# Patient Record
Sex: Female | Born: 1975 | Race: White | Hispanic: No | Marital: Married | Smoking: Former smoker
Health system: Southern US, Community
[De-identification: ages and names within clinical notes are randomized; demographics above are authoritative.]

## PROBLEM LIST (undated history)

## (undated) DIAGNOSIS — Z8619 Personal history of other infectious and parasitic diseases: Secondary | ICD-10-CM

## (undated) DIAGNOSIS — Z8639 Personal history of other endocrine, nutritional and metabolic disease: Secondary | ICD-10-CM

## (undated) DIAGNOSIS — N8110 Cystocele, unspecified: Secondary | ICD-10-CM

## (undated) DIAGNOSIS — M329 Systemic lupus erythematosus, unspecified: Secondary | ICD-10-CM

## (undated) DIAGNOSIS — R112 Nausea with vomiting, unspecified: Secondary | ICD-10-CM

## (undated) DIAGNOSIS — Z8739 Personal history of other diseases of the musculoskeletal system and connective tissue: Secondary | ICD-10-CM

## (undated) DIAGNOSIS — N816 Rectocele: Secondary | ICD-10-CM

## (undated) DIAGNOSIS — N390 Urinary tract infection, site not specified: Secondary | ICD-10-CM

## (undated) DIAGNOSIS — Z9889 Other specified postprocedural states: Secondary | ICD-10-CM

## (undated) DIAGNOSIS — E039 Hypothyroidism, unspecified: Secondary | ICD-10-CM

## (undated) DIAGNOSIS — E079 Disorder of thyroid, unspecified: Secondary | ICD-10-CM

## (undated) DIAGNOSIS — IMO0002 Reserved for concepts with insufficient information to code with codable children: Secondary | ICD-10-CM

## (undated) HISTORY — PX: TONSILLECTOMY: SUR1361

## (undated) HISTORY — DX: Cystocele, unspecified: N81.10

## (undated) HISTORY — DX: Personal history of other infectious and parasitic diseases: Z86.19

## (undated) HISTORY — PX: KNEE OPEN LATERAL RELEASE: SHX1897

## (undated) HISTORY — DX: Reserved for concepts with insufficient information to code with codable children: IMO0002

## (undated) HISTORY — DX: Systemic lupus erythematosus, unspecified: M32.9

## (undated) HISTORY — DX: Personal history of other diseases of the musculoskeletal system and connective tissue: Z87.39

## (undated) HISTORY — DX: Urinary tract infection, site not specified: N39.0

## (undated) HISTORY — DX: Rectocele: N81.6

## (undated) HISTORY — DX: Disorder of thyroid, unspecified: E07.9

## (undated) HISTORY — DX: Personal history of other endocrine, nutritional and metabolic disease: Z86.39

---
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
## 2006-08-16 HISTORY — PX: SEPTOPLASTY: SHX2393

## 2006-08-16 HISTORY — PX: APPENDECTOMY: SHX54

## 2008-01-11 ENCOUNTER — Inpatient Hospital Stay (HOSPITAL_COMMUNITY): Admission: AD | Admit: 2008-01-11 | Discharge: 2008-01-13 | Payer: Self-pay | Admitting: Obstetrics and Gynecology

## 2010-10-10 ENCOUNTER — Inpatient Hospital Stay (HOSPITAL_COMMUNITY): Admission: AD | Admit: 2010-10-10 | Payer: Self-pay | Admitting: Obstetrics and Gynecology

## 2010-10-11 ENCOUNTER — Inpatient Hospital Stay (HOSPITAL_COMMUNITY)
Admission: AD | Admit: 2010-10-11 | Discharge: 2010-10-13 | DRG: 372 | Disposition: A | Discharge status: Home / Self Care | Payer: BC Managed Care – PPO | Source: Ambulatory Visit | Attending: Obstetrics and Gynecology | Admitting: Obstetrics and Gynecology

## 2010-10-11 DIAGNOSIS — E079 Disorder of thyroid, unspecified: Secondary | ICD-10-CM | POA: Diagnosis present

## 2010-10-11 DIAGNOSIS — O09529 Supervision of elderly multigravida, unspecified trimester: Secondary | ICD-10-CM | POA: Diagnosis present

## 2010-10-11 DIAGNOSIS — E039 Hypothyroidism, unspecified: Secondary | ICD-10-CM | POA: Diagnosis present

## 2010-10-12 LAB — CBC
HCT: 34.9 % — ABNORMAL LOW (ref 36.0–46.0)
MCH: 29.3 pg (ref 26.0–34.0)
MCHC: 33 g/dL (ref 30.0–36.0)
MCV: 88.8 fL (ref 78.0–100.0)
RDW: 14.2 % (ref 11.5–15.5)

## 2010-12-29 NOTE — Op Note (Signed)
Shannon Howell, Shannon Howell           ACCOUNT NO.:  [id number]   MEDICAL RECORD NO.:  [id number]          PATIENT TYPE:  INP   LOCATION:  9140                          FACILITY:  [facility]   PHYSICIAN:  Kendra H. Tenny Craw, MD     DATE OF BIRTH:  1975-10-26   DATE OF PROCEDURE:  01/11/2008  DATE OF DISCHARGE:                               OPERATIVE REPORT   PREOPERATIVE DIAGNOSES:  1. Left vaginal sulcus laceration.  2. Right labial laceration.  3. Second-degree laceration.   POSTOPERATIVE DIAGNOSES:  1. Left vaginal sulcus laceration.  2. Right labial laceration.  3. Second-degree laceration.   PROCEDURE:  Exam under anesthesia and repair of left paravaginal space,  left vaginal sulcus tear, right labial tear, and second-degree  laceration.   SURGEON:  Freddrick March. Tenny Craw, MD   ASSISTANTSLarene Beach, surgical tech.  Vee, surgical tech.   ANESTHESIA:  Epidural.   FINDINGS:  A large left vaginal sulcus laceration, left paravaginal  defect, right labial laceration, and a second-degree laceration.   SPECIMENS:  None.   ESTIMATED BLOOD LOSS:  300 mL.   PROCEDURE:  [name] is a 35 year old G1, P0; now G1, P1, status  post a spontaneous vaginal delivery of an 8 pounds 14 ounces female  infant in the vertex presentation at approximately 10 p.m.  Prior to  delivery of the infant, during pushing, the nurse noted a sudden onset  of vaginal bleeding.  At this point, I was called to the room for  delivery.  Fetal heart tones at this time were reassuring.  Upon  inspection of the perineum, a small pumping vessel was noted on the left  hand side of the vagina, which was tamponoded with a hemostat.  The  vaginal delivery was completed without complication.  After delivery  upon inspection of the vagina, a large left vaginal sulcus tear was  noted which extended up to the level of the cervix, and a large left  paravaginal defect was noted.  Given the suboptimal visualization and  retraction, the  decision was made to proceed to the operating room for  repair of the sulcus laceration.  The vagina and paravaginal space were  packed and the patient was transported to the operating room.  In the  operating room, the patient was placed in dorsal lithotomy position in  [place] stirrups.  Epidural anesthesia was confirmed to be adequate.  Upon  inspection of the vagina, again, a large sulcus tear up to the level of  the cervix was noted, and the large paravaginal defect was noted, which  extended just posterior to the pubic bone.  Upon the lacerated vaginal  portion of the tear, bleeding was noted, and this was repaired with a 3-  0 Vicryl in a running locked fashion.  The vaginal sulcus tear and  paravaginal defects were then repaired with 3-0 Vicryl in a running  fashion, and the electrocautery unit was used to cauterize some  extraneous bleeders.  A left labial laceration was repaired with 4-0  Vicryl in a running fashion.  A right labial tear was repaired with 4-0  Vicryl in  a running fashion, and a second-degree laceration was then  repaired with 3-0 Vicryl in the usual fashion.  All sponge, lap, and  needle counts were correct x2.  The patient tolerated the procedure well  and was brought to the recovery room in stable condition following the  procedure.       Freddrick March. Tenny Craw, MD  Electronically Signed     KHR/MEDQ  D:  01/12/2008  T:  01/12/2008  Job:  [id number]

## 2011-05-12 LAB — CBC
Hemoglobin: 9.5 — ABNORMAL LOW
MCHC: 34
MCHC: 34.2
MCHC: 35.2
MCV: 91.1
Platelets: 169
Platelets: 176
RBC: 2.99 — ABNORMAL LOW
RDW: 14
RDW: 14
RDW: 14.2

## 2011-05-12 LAB — COMPREHENSIVE METABOLIC PANEL
ALT: 12
Albumin: 3.1 — ABNORMAL LOW
Alkaline Phosphatase: 138 — ABNORMAL HIGH
Calcium: 9.2
Potassium: 3.5
Sodium: 135
Total Protein: 6.3

## 2011-05-12 LAB — RPR: RPR Ser Ql: NONREACTIVE

## 2011-05-12 LAB — TYPE AND SCREEN: Antibody Screen: NEGATIVE

## 2011-05-12 LAB — ABO/RH: ABO/RH(D): O POS

## 2011-05-12 LAB — URIC ACID: Uric Acid, Serum: 4.3

## 2011-10-13 ENCOUNTER — Encounter: Payer: BC Managed Care – PPO | Admitting: Obstetrics and Gynecology

## 2011-11-23 DIAGNOSIS — N61 Mastitis without abscess: Secondary | ICD-10-CM | POA: Insufficient documentation

## 2011-11-24 ENCOUNTER — Ambulatory Visit (INDEPENDENT_AMBULATORY_CARE_PROVIDER_SITE_OTHER): Payer: BC Managed Care – PPO | Admitting: Obstetrics and Gynecology

## 2011-11-24 ENCOUNTER — Encounter: Payer: Self-pay | Admitting: Obstetrics and Gynecology

## 2011-11-24 VITALS — BP 122/80 | Resp 18 | Ht 70.0 in | Wt 150.0 lb

## 2011-11-24 DIAGNOSIS — N816 Rectocele: Secondary | ICD-10-CM | POA: Insufficient documentation

## 2011-11-24 DIAGNOSIS — N814 Uterovaginal prolapse, unspecified: Secondary | ICD-10-CM | POA: Insufficient documentation

## 2011-11-24 DIAGNOSIS — Z01419 Encounter for gynecological examination (general) (routine) without abnormal findings: Secondary | ICD-10-CM

## 2011-11-24 DIAGNOSIS — N8110 Cystocele, unspecified: Secondary | ICD-10-CM | POA: Insufficient documentation

## 2011-11-24 NOTE — Progress Notes (Signed)
Patient ID: Shannon Howell, female   DOB: Dec 01, 1975, 36 y.o.   MRN: [id number]  Chief Complaint  Patient presents with  . Gynecologic Exam    AEX. Wants her Pap. JO. No PCP. Mammo  2011 wnl. urinary and bowel function impaired due to cystocele and rectocele. Surgery pending with AR. Eats healthy, takes Prenatal vitamin, drinks etoh socially. No drug abuse, no smoking. Reg exercise 6x q week./ cardio and wts.. No colonoscopy yet.  JO  . Vaginal Prolapse    HPI Shannon Howell is a 36 y.o. female.  She presents for AEX c/o cystocele, rectocele and uterine prolapse.  HPI  Past Medical History  Diagnosis Date  . H/O scoliosis   . History of chicken pox   . UTI (lower urinary tract infection)   . Asthma     activity induced  . H/O thyroid disease     hashimotos  . History of lupus   . Female cystocele   . Rectocele, female   . Thyroid disease   . Systemic lupus erythematosus     Past Surgical History  Procedure Date  . Septoplasty 2008  . Knee open lateral release 1995 & 1996    right and left  . Wisdom tooth extraction 1993  . Tonsillectomy   . Appendectomy 2008    Family History  Problem Relation Age of Onset  . Heart disease Maternal Grandmother     Social History History  Substance Use Topics  . Smoking status: Former Games developer  . Smokeless tobacco: Former Neurosurgeon  . Alcohol Use: Yes     social    Allergies  Allergen Reactions  . Penicillins Swelling    Current Outpatient Prescriptions  Medication Sig Dispense Refill  . levothyroxine (SYNTHROID, LEVOTHROID) 150 MCG tablet Take 150 mcg by mouth daily.      . medroxyPROGESTERone (DEPO-PROVERA) 150 MG/ML injection Inject 150 mg into the muscle every 3 (three) months.      . Prenatal Vit-Fe Fumarate-FA (MULTIVITAMIN-PRENATAL) 27-0.8 MG TABS Take 1 tablet by mouth daily.      . cephALEXin (KEFLEX) 500 MG capsule Take 500 mg by mouth 4 (four) times daily.      Marland Kitchen ibuprofen (ADVIL,MOTRIN) 600 MG tablet Take  600 mg by mouth every 6 (six) hours as needed.      . traMADol (ULTRAM) 50 MG tablet Take 50 mg by mouth every 6 (six) hours as needed.        Review of Systems Review of Systems Noncontributory except also c/o leaking urine with activity  Blood pressure 122/80, resp. rate 18, height 5\' 10"  (1.778 m), weight 150 lb (68.04 kg), last menstrual period 12/14/2009, currently breastfeeding.  Physical Exam Physical Exam Physical Examination: General appearance - alert, well appearing, and in no distress Neck - nodular (already followed h/o Hashimoto's) Chest - clear to auscultation, no wheezes, rales or rhonchi, symmetric air entry Heart - normal rate and regular rhythm Abdomen - soft, nontender, nondistended, no masses or organomegaly Breasts - breasts appear normal, no suspicious masses, no skin or nipple changes or axillary nodes Pelvic - VULVA: normal appearing vulva with no masses, tenderness or lesions, VAGINA: grade 2 -3 cystocele, CERVIX: normal appearing cervix without discharge or lesions, UTERUS: uterus is normal size, shape, consistency and nontender, with grade 2 prolapse, ADNEXA: normal adnexa in size, nontender and no masses, RECTAL: normal rectal, no masses, small rectocele Extremities - no calf tenderness   Assessment   Pelvic Relaxation with SUI  Plan    Pt desires surgical management.  Plan TVH/A-P Repair/TVT Schedule Cystometrics Pap done Cont Depo until procedure May take claritin or zyrtec        Tacy Chavis Y 11/24/2011, 11:05 AM

## 2011-11-24 NOTE — Patient Instructions (Addendum)
Please schedule Cystometrics next available and f/u visit.

## 2011-11-25 LAB — PAP IG W/ RFLX HPV ASCU

## 2011-12-08 ENCOUNTER — Telehealth: Payer: Self-pay | Admitting: Obstetrics and Gynecology

## 2011-12-10 ENCOUNTER — Telehealth: Payer: Self-pay | Admitting: Obstetrics and Gynecology

## 2011-12-21 ENCOUNTER — Telehealth: Payer: Self-pay | Admitting: Obstetrics and Gynecology

## 2011-12-21 NOTE — Telephone Encounter (Signed)
Call to pt to schedule Lumax per AR May 30th at [facility]

## 2012-01-05 ENCOUNTER — Telehealth: Payer: Self-pay | Admitting: Obstetrics and Gynecology

## 2012-01-26 ENCOUNTER — Ambulatory Visit (INDEPENDENT_AMBULATORY_CARE_PROVIDER_SITE_OTHER): Payer: BC Managed Care – PPO | Admitting: Obstetrics and Gynecology

## 2012-01-26 ENCOUNTER — Encounter: Payer: Self-pay | Admitting: Obstetrics and Gynecology

## 2012-01-26 VITALS — BP 130/80 | Resp 16 | Ht 70.0 in | Wt 152.0 lb

## 2012-01-26 DIAGNOSIS — N816 Rectocele: Secondary | ICD-10-CM

## 2012-01-26 DIAGNOSIS — N8110 Cystocele, unspecified: Secondary | ICD-10-CM

## 2012-01-26 DIAGNOSIS — N814 Uterovaginal prolapse, unspecified: Secondary | ICD-10-CM

## 2012-01-26 DIAGNOSIS — N8111 Cystocele, midline: Secondary | ICD-10-CM

## 2012-01-26 NOTE — Progress Notes (Signed)
Reports area at introitus she would like fixed at time of surgery Lumax results reviewed.  Despite no obvious significant SUI during procedure pt would like to proceed with TVT secondary to her clinical sxs of SUI. I reviewed r/b/a of the surgery.  Pt is planning to undergo TVH/A-P Repair/TVT and Cystoscopy Questions answered  Plan TVH/A-P Repair/TVT/Cystoscopy

## 2012-02-04 ENCOUNTER — Telehealth: Payer: Self-pay | Admitting: Obstetrics and Gynecology

## 2012-02-04 NOTE — Telephone Encounter (Signed)
TVH; A&P Repair; TVT/Cystoscopy scheduled for 04/26/12 @ 9:30 with AR/EP.  BCBS effective 12/15/10.  Plan pays 85/15 after a $300 deductible. Pre-op due $382.46. Adrianne Pridgen

## 2012-02-09 ENCOUNTER — Other Ambulatory Visit: Payer: Self-pay | Admitting: Obstetrics and Gynecology

## 2012-04-12 ENCOUNTER — Encounter (HOSPITAL_COMMUNITY): Payer: Self-pay | Admitting: Pharmacist

## 2012-04-18 ENCOUNTER — Encounter: Payer: Self-pay | Admitting: Obstetrics and Gynecology

## 2012-04-18 ENCOUNTER — Ambulatory Visit (INDEPENDENT_AMBULATORY_CARE_PROVIDER_SITE_OTHER): Payer: BC Managed Care – PPO | Admitting: Obstetrics and Gynecology

## 2012-04-18 ENCOUNTER — Encounter (HOSPITAL_COMMUNITY)
Admission: RE | Admit: 2012-04-18 | Discharge: 2012-04-18 | Disposition: A | Discharge status: Home / Self Care | Payer: BC Managed Care – PPO | Source: Ambulatory Visit | Attending: Obstetrics and Gynecology | Admitting: Obstetrics and Gynecology

## 2012-04-18 ENCOUNTER — Encounter (HOSPITAL_COMMUNITY): Payer: Self-pay

## 2012-04-18 VITALS — BP 110/72 | HR 62 | Temp 99.6°F | Resp 14 | Ht 70.5 in | Wt 156.0 lb

## 2012-04-18 DIAGNOSIS — R32 Unspecified urinary incontinence: Secondary | ICD-10-CM

## 2012-04-18 DIAGNOSIS — Z01818 Encounter for other preprocedural examination: Secondary | ICD-10-CM

## 2012-04-18 DIAGNOSIS — N814 Uterovaginal prolapse, unspecified: Secondary | ICD-10-CM

## 2012-04-18 DIAGNOSIS — N8189 Other female genital prolapse: Secondary | ICD-10-CM

## 2012-04-18 HISTORY — DX: Hypothyroidism, unspecified: E03.9

## 2012-04-18 HISTORY — DX: Other specified postprocedural states: Z98.890

## 2012-04-18 HISTORY — DX: Other specified postprocedural states: R11.2

## 2012-04-18 LAB — SURGICAL PCR SCREEN
MRSA, PCR: NEGATIVE
Staphylococcus aureus: NEGATIVE

## 2012-04-18 LAB — CBC
MCV: 92.9 fL (ref 78.0–100.0)
Platelets: 164 10*3/uL (ref 150–400)

## 2012-04-18 NOTE — Progress Notes (Signed)
Shannon Howell is a 36 y.o. female [phone] who presents for a total vaginal hysterectomy, placement of tension free vaginal tape, anterior-posterior repair and cystoscopy because of symptomatic uterine prolapse and urinary incontinence.  Since patient's first delivery, when she sustained a fourth degree sulcus tear, she reports feeling a bulge at her vaginal opening from time to time and leaking of urine with various activities.  With squatting, running or jumping on a trampoline the patient will leak urine, especially when her bladder is full. She has to manipulate a tampon in order to place it properly within the vagina and digitally assist with evacuation of stool. There is also reported discomfort with intercourse due to the lowered position of her uterus.  In June of this year the patient underwent cystometrics (Lumax testing) and though there was no significant findings of stress urinary incontinence, patient wants to proceed with surgical management for her clinical symptoms. A review of both medical and surgical management options were given to patient for all of her symptoms and after careful consideration she still chooses surgery.   Past Medical History  OB History: [id number] SVD: 2009 and 2012 (largest infant 9lbs, 3oz); History of fourth degree sulcus tear.  GYN History: menarche 36 YO;  LMP 03/22/12;  Contraception Depo Provera;   The patient denies history of sexually transmitted disease. 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 Clobetasol Prop Emollient Base 0.05 % emollient cream Apply topically 2 (two) times daily as needed. For Lupus rash.      . levalbuterol (XOPENEX HFA) 45 MCG/ACT inhaler Inhale 1-2 puffs into the lungs every 6 (six) hours as needed. For Asthma symptoms.      Marland Kitchen levothyroxine (SYNTHROID, LEVOTHROID) 150 MCG tablet Take 175 mcg by mouth daily.       . medroxyPROGESTERone (DEPO-PROVERA) 150 MG/ML injection Inject 150 mg into the muscle every 3 (three) months.      . Prenatal Vit-Fe Fumarate-FA (MULTIVITAMIN-PRENATAL) 27-0.8 MG TABS Take 1 tablet by mouth daily.      . cephALEXin (KEFLEX) 500 MG capsule Take 500 mg by mouth 4 (four) times daily.      Marland Kitchen ibuprofen (ADVIL,MOTRIN) 600 MG tablet Take 600 mg by mouth every 6 (six) hours as needed.      . traMADol (ULTRAM) 50 MG tablet Take 50 mg by mouth every 6 (six) hours as needed.        Allergies  Allergen Reactions  . Penicillins Swelling    Childhood Rxn.  Can take Keflex ok.   Denies sensitivity to latex, soy, shellfish, peanuts or adhesives   ROS: Admits to contact lenses, seasonal allergies and occasional Lupus rash;  Denies headache, vision changes, dysphagia, tinnitus, dizziness,  chest pain, shortness of breath, nausea, vomiting, diarrhea, dysuria, hematuria, pelvic pain, swelling of joints,easy bruising,  myalgias, arthralgias and except as is mentioned in the history of present illness, patient's review of systems is otherwise negative  Physical Exam    BP 110/72  Pulse 62  Temp 99.6 F (37.6 C) (Oral)  Resp 14  Ht 5' 10.5" (1.791 m)  Wt 156 lb (70.761 kg)  BMI 22.07 kg/m2  Neck: supple without masses or thyromegaly Lungs: clear to auscultation Heart: regular rate and rhythm Abdomen: soft, non-tender and no organomegaly Pelvic:EGBUS-  wnl; vagina-3/4 cystocele, 2/4 rectocele; uterus-normal size and descended half the length of vagina, cervix without lesions or motion tenderness; adnexae-no tenderness or masses Extremities:  no clubbing, cyanosis or edema   Assesment:  Symptomatic Uterine Prolapse                        Urinary Incontinence   Disposition:  A discussion was held with patient regarding the indication for her procedure(s) along with the risks, which include but are not limited to: reaction to anesthesia, damage to adjacent organs, infection,  excessive bleeding, worsening of urinary symptoms, pelvic prolapse, early menopause and erosion of mesh.  Patient verbalized understanding of these risks and has consented to proceed with a Total Vaginal Hysterectomy, Anterior-Posterior Repair, Placement of Tension Free Vaginal Tape and Cystoscopy at [facility] [place] on April 26, 2012 at 9:30 a.m.  CSN# [id number]   Ellyson Rarick J. Lowell Guitar, PA-C  for Dr. Woodroe Mode. Su Hilt

## 2012-04-18 NOTE — Patient Instructions (Signed)
Your procedure is scheduled on:04/26/12  Enter through the Main Entrance at :8am Pick up desk phone and dial [phone] and inform us of your arrival.  Please call [phone] if you have any problems the morning of surgery.  Remember: Do not eat after midnight: Tuesday Do not drink after:5:30 am Wed- water only  Take these meds the morning of surgery with a sip of water:thyroid  med  DO NOT wear jewelry, eye make-up, lipstick,body lotion, or dark fingernail polish. Do not shave for 48 hours prior to surgery.  If you are to be admitted after surgery, leave suitcase in car until your room has been assigned. Patients discharged on the day of surgery will not be allowed to drive home.   Remember to use your Hibiclens as instructed.

## 2012-04-19 NOTE — H&P (Signed)
Shannon Howell is a 35 y.o. female G2P2002 who presents for a total vaginal hysterectomy, placement of tension free vaginal tape, anterior-posterior repair and cystoscopy because of symptomatic uterine prolapse and urinary incontinence. Since patient's first delivery, when she sustained a fourth degree sulcus tear, she reports feeling a bulge at her vaginal opening from time to time and leaking of urine with various activities. With squatting, running or jumping on a trampoline the patient will leak urine, especially when her bladder is full. She has to manipulate a tampon in order to place it properly within the vagina and digitally assist with evacuation of stool. There is also reported discomfort with intercourse due to the lowered position of her uterus. In June of this year the patient underwent cystometrics (Lumax testing) and though there was no significant findings of stress urinary incontinence, patient wants to proceed with surgical management for her clinical symptoms. A review of both medical and surgical management options were given to patient for all of her symptoms and after careful consideration she still chooses surgery.   Past Medical History   OB History: G2P2002 SVD: 2009 and 2012 (largest infant 9lbs, 3oz); History of fourth degree sulcus tear.   GYN History: menarche 36 YO; LMP 03/22/12 (patient has been mostly amenorrheic since being on Depo Provera); Contraception Depo Provera; The patient denies history of sexually transmitted disease. 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  Clobetasol Prop Emollient Base 0.05 % emollient cream  Apply topically 2 (two) times daily as needed. For Lupus rash.     .  levalbuterol (XOPENEX HFA) 45 MCG/ACT inhaler  Inhale 1-2 puffs into the lungs every 6 (six) hours as needed. For Asthma symptoms.     .  levothyroxine (SYNTHROID, LEVOTHROID) 150 MCG tablet  Take 175 mcg by mouth daily.     .  medroxyPROGESTERone (DEPO-PROVERA) 150 MG/ML injection  Inject 150 mg into the muscle every 3 (three) months.     .  Prenatal Vit-Fe Fumarate-FA (MULTIVITAMIN-PRENATAL) 27-0.8 MG TABS  Take 1 tablet by mouth daily.     .  cephALEXin (KEFLEX) 500 MG capsule  Take 500 mg by mouth 4 (four) times daily.     .  ibuprofen (ADVIL,MOTRIN) 600 MG tablet  Take 600 mg by mouth every 6 (six) hours as needed.     .  traMADol (ULTRAM) 50 MG tablet  Take 50 mg by mouth every 6 (six) hours as needed.      Allergies   Allergen  Reactions   .  Penicillins  Swelling     Childhood Rxn. Can take Keflex ok.    Denies sensitivity to latex, soy, shellfish, peanuts or adhesives   ROS: Admits to contact lenses, seasonal allergies and occasional Lupus rash; Denies headache, vision changes, dysphagia, tinnitus, dizziness, chest pain, shortness of breath, nausea, vomiting, diarrhea, dysuria, hematuria, pelvic pain, swelling of joints,easy bruising, myalgias, arthralgias and   except as is mentioned in the history of present illness, patient's review of systems is otherwise negative.   Physical Exam   BP 110/72  Pulse 62  Temp 99.6 F (37.6 C) (Oral)  Resp 14  Ht 5' 10.5" (1.791 m)  Wt 156 lb (70.761 kg)  BMI 22.07 kg/m2   Neck: supple without masses or thyromegaly  Lungs: clear to auscultation  Heart: regular rate and  rhythm  Abdomen: soft, non-tender and no organomegaly  Pelvic:EGBUS- wnl; vagina-3/4 cystocele, 2/4 rectocele; uterus-normal size and descended half the length of vagina, cervix without lesions or motion tenderness; adnexae-no tenderness or masses  Extremities: no clubbing, cyanosis or edema   Assesment: Symptomatic Uterine Prolapse                        Urinary Incontinence    Disposition: A discussion was held with patient regarding the indication for her procedure(s) along with the risks, which include but are not limited to: reaction to anesthesia, damage to adjacent organs, infection, excessive bleeding, worsening of urinary symptoms, pelvic prolapse, early menopause and erosion of mesh. Patient verbalized understanding of these risks and has consented to proceed with a Total Vaginal Hysterectomy, Anterior-Posterior Repair, Placement of Tension Free Vaginal Tape and Cystoscopy at [facility] on April 26, 2012 at 9:30 a.m.   CSN# [id number]   Sakshi Sermons J. Cuahutemoc Attar, PA-C for Dr. Angela Y. Roberts     

## 2012-04-25 MED ORDER — GENTAMICIN SULFATE 40 MG/ML IJ SOLN
INTRAMUSCULAR | Status: AC
  Administered 2012-04-26: 100 mL via INTRAVENOUS
  Filled 2012-04-25: qty 8.5

## 2012-04-25 NOTE — H&P (Addendum)
Shannon Howell is a 36 y.o. female [phone] who presents for a total vaginal hysterectomy, placement of tension free vaginal tape, anterior-posterior repair and cystoscopy because of symptomatic uterine prolapse and urinary incontinence. Since patient's first delivery, when she sustained a fourth degree sulcus tear, she reports feeling a bulge at her vaginal opening from time to time and leaking of urine with various activities. With squatting, running or jumping on a trampoline the patient will leak urine, especially when her bladder is full. She has to manipulate a tampon in order to place it properly within the vagina and digitally assist with evacuation of stool. There is also reported discomfort with intercourse due to the lowered position of her uterus. In June of this year the patient underwent cystometrics (Lumax testing) and though there was no significant findings of stress urinary incontinence, patient wants to proceed with surgical management for her clinical symptoms. A review of both medical and surgical management options were given to patient for all of her symptoms and after careful consideration she still chooses surgery.   Past Medical History   OB History: [id number] SVD: 2009 and 2012 (largest infant 9lbs, 3oz); History of fourth degree sulcus tear.   GYN History: menarche 35 YO; LMP 03/22/12 (patient has been mostly amenorrheic since being on Depo Provera); Contraception Depo Provera; The patient denies history of sexually transmitted disease. ; Denies history of abnormal PAP smear; Last PAP smear April 2012  Medical History: thyroid disease (Hashimotos), asthma (activity induced); discoid lupus   Surgical History: 1989-Tonsillectomy; 1999 & 2000-Left & Right Knee Lateral Release; 2006-Septoplasty and 2007-Appendectomy  Denies history of blood transfusions and has severe nausea and vomiting with anesthesia   Family History: Atrial Fibrillation, thyroid disease and non-Hodgkins  Lymphoma   Social History: Married and works as a Gaffer; Uses alcohol socially but denies tobacco or illicit drug use; Patient is breastfeeding   Outpatient Encounter Prescriptions as of 04/18/2012   Medication  Sig  Dispense  Refill   .  Clobetasol Prop Emollient Base 0.05 % emollient cream  Apply topically 2 (two) times daily as needed. For Lupus rash.     .  levalbuterol (XOPENEX HFA) 45 MCG/ACT inhaler  Inhale 1-2 puffs into the lungs every 6 (six) hours as needed. For Asthma symptoms.     Marland Kitchen  levothyroxine (SYNTHROID, LEVOTHROID) 150 MCG tablet  Take 175 mcg by mouth daily.     .  medroxyPROGESTERone (DEPO-PROVERA) 150 MG/ML injection  Inject 150 mg into the muscle every 3 (three) months.     .  Prenatal Vit-Fe Fumarate-FA (MULTIVITAMIN-PRENATAL) 27-0.8 MG TABS  Take 1 tablet by mouth daily.     .  cephALEXin (KEFLEX) 500 MG capsule  Take 500 mg by mouth 4 (four) times daily.     Marland Kitchen  ibuprofen (ADVIL,MOTRIN) 600 MG tablet  Take 600 mg by mouth every 6 (six) hours as needed.     .  traMADol (ULTRAM) 50 MG tablet  Take 50 mg by mouth every 6 (six) hours as needed.      Allergies   Allergen  Reactions   .  Penicillins  Swelling     Childhood Rxn. Can take Keflex ok.    Denies sensitivity to latex, soy, shellfish, peanuts or adhesives   ROS: Admits to contact lenses, seasonal allergies and occasional Lupus rash; Denies headache, vision changes, dysphagia, tinnitus, dizziness, chest pain, shortness of breath, nausea, vomiting, diarrhea, dysuria, hematuria, pelvic pain, swelling of joints,easy bruising, myalgias, arthralgias and  except as is mentioned in the history of present illness, patient's review of systems is otherwise negative.   Physical Exam   BP 110/72  Pulse 62  Temp 99.6 F (37.6 C) (Oral)  Resp 14  Ht 5' 10.5" (1.791 m)  Wt 156 lb (70.761 kg)  BMI 22.07 kg/m2   Neck: supple without masses or thyromegaly  Lungs: clear to auscultation  Heart: regular rate and  rhythm  Abdomen: soft, non-tender and no organomegaly  Pelvic:EGBUS- wnl; vagina-3/4 cystocele, 2/4 rectocele; uterus-normal size and descended half the length of vagina, cervix without lesions or motion tenderness; adnexae-no tenderness or masses  Extremities: no clubbing, cyanosis or edema   Assesment: Symptomatic Uterine Prolapse                        Urinary Incontinence    Disposition: A discussion was held with patient regarding the indication for her procedure(s) along with the risks, which include but are not limited to: reaction to anesthesia, damage to adjacent organs, infection, excessive bleeding, worsening of urinary symptoms, pelvic prolapse, early menopause and erosion of mesh. Patient verbalized understanding of these risks and has consented to proceed with a Total Vaginal Hysterectomy, Anterior-Posterior Repair, Placement of Tension Free Vaginal Tape and Cystoscopy at [facility] [place] on April 26, 2012 at 9:30 a.m.   CSN# [id number]   [place] J. Lowell Guitar, PA-C for Dr. Woodroe Mode. Su Hilt    Agree with above.  R/B/A reviewed.  Consent s/w.

## 2012-04-26 ENCOUNTER — Encounter (HOSPITAL_COMMUNITY): Payer: Self-pay | Admitting: Anesthesiology

## 2012-04-26 ENCOUNTER — Ambulatory Visit (HOSPITAL_COMMUNITY)
Admission: RE | Admit: 2012-04-26 | Discharge: 2012-04-27 | Disposition: A | Discharge status: Home / Self Care | Payer: BC Managed Care – PPO | Source: Ambulatory Visit | Attending: Obstetrics and Gynecology | Admitting: Obstetrics and Gynecology

## 2012-04-26 ENCOUNTER — Encounter (HOSPITAL_COMMUNITY)
Admission: RE | Disposition: A | Discharge status: Home / Self Care | Payer: Self-pay | Source: Ambulatory Visit | Attending: Obstetrics and Gynecology

## 2012-04-26 ENCOUNTER — Ambulatory Visit (HOSPITAL_COMMUNITY): Payer: BC Managed Care – PPO | Admitting: Anesthesiology

## 2012-04-26 ENCOUNTER — Encounter (HOSPITAL_COMMUNITY): Payer: Self-pay

## 2012-04-26 ENCOUNTER — Encounter (HOSPITAL_COMMUNITY): Payer: Self-pay | Admitting: *Deleted

## 2012-04-26 DIAGNOSIS — N393 Stress incontinence (female) (male): Secondary | ICD-10-CM

## 2012-04-26 DIAGNOSIS — N816 Rectocele: Secondary | ICD-10-CM

## 2012-04-26 DIAGNOSIS — N812 Incomplete uterovaginal prolapse: Secondary | ICD-10-CM | POA: Insufficient documentation

## 2012-04-26 DIAGNOSIS — Z01812 Encounter for preprocedural laboratory examination: Secondary | ICD-10-CM | POA: Insufficient documentation

## 2012-04-26 DIAGNOSIS — N8189 Other female genital prolapse: Secondary | ICD-10-CM

## 2012-04-26 DIAGNOSIS — Z01818 Encounter for other preprocedural examination: Secondary | ICD-10-CM | POA: Insufficient documentation

## 2012-04-26 HISTORY — PX: VAGINAL HYSTERECTOMY: SHX2639

## 2012-04-26 HISTORY — PX: BLADDER SUSPENSION: SHX72

## 2012-04-26 HISTORY — PX: CYSTOSCOPY: SHX5120

## 2012-04-26 HISTORY — PX: ANTERIOR AND POSTERIOR REPAIR: SHX5121

## 2012-04-26 LAB — HCG, SERUM, QUALITATIVE: Preg, Serum: NEGATIVE

## 2012-04-26 SURGERY — HYSTERECTOMY, VAGINAL
Anesthesia: General | Site: Vagina | Wound class: Clean Contaminated

## 2012-04-26 MED ORDER — CEFAZOLIN SODIUM-DEXTROSE 2-3 GM-% IV SOLR
INTRAVENOUS | Status: AC
  Filled 2012-04-26: qty 50

## 2012-04-26 MED ORDER — LEVOTHYROXINE SODIUM 175 MCG PO TABS
175.0000 ug | ORAL_TABLET | Freq: Every day | ORAL | Status: DC
  Administered 2012-04-27: 175 ug via ORAL
  Filled 2012-04-26 (×2): qty 1

## 2012-04-26 MED ORDER — DEXAMETHASONE SODIUM PHOSPHATE 10 MG/ML IJ SOLN
INTRAMUSCULAR | Status: DC | PRN
  Administered 2012-04-26: 10 mg via INTRAVENOUS

## 2012-04-26 MED ORDER — ESTRADIOL 0.1 MG/GM VA CREA
TOPICAL_CREAM | VAGINAL | Status: AC
  Filled 2012-04-26: qty 42.5

## 2012-04-26 MED ORDER — TRAMADOL HCL 50 MG PO TABS
50.0000 mg | ORAL_TABLET | Freq: Four times a day (QID) | ORAL | Status: DC | PRN
  Administered 2012-04-27: 50 mg via ORAL
  Filled 2012-04-26: qty 1

## 2012-04-26 MED ORDER — FENTANYL CITRATE 0.05 MG/ML IJ SOLN
INTRAMUSCULAR | Status: AC
  Filled 2012-04-26: qty 2

## 2012-04-26 MED ORDER — VASOPRESSIN 20 UNIT/ML IJ SOLN
INTRAVENOUS | Status: DC | PRN
  Administered 2012-04-26: 11:00:00 via INTRAMUSCULAR

## 2012-04-26 MED ORDER — PROPOFOL 10 MG/ML IV BOLUS
INTRAVENOUS | Status: DC | PRN
  Administered 2012-04-26: 200 mg via INTRAVENOUS

## 2012-04-26 MED ORDER — INDIGOTINDISULFONATE SODIUM 8 MG/ML IJ SOLN
INTRAMUSCULAR | Status: DC | PRN
  Administered 2012-04-26: 5 mL via INTRAVENOUS

## 2012-04-26 MED ORDER — SCOPOLAMINE 1 MG/3DAYS TD PT72
MEDICATED_PATCH | TRANSDERMAL | Status: AC
  Filled 2012-04-26: qty 1

## 2012-04-26 MED ORDER — MIDAZOLAM HCL 5 MG/5ML IJ SOLN
INTRAMUSCULAR | Status: DC | PRN
  Administered 2012-04-26: 2 mg via INTRAVENOUS

## 2012-04-26 MED ORDER — HYDROMORPHONE HCL PF 1 MG/ML IJ SOLN
0.2500 mg | INTRAMUSCULAR | Status: DC | PRN
  Administered 2012-04-26: 0.5 mg via INTRAVENOUS

## 2012-04-26 MED ORDER — LEVALBUTEROL TARTRATE 45 MCG/ACT IN AERO
1.0000 | INHALATION_SPRAY | Freq: Four times a day (QID) | RESPIRATORY_TRACT | Status: DC | PRN
  Filled 2012-04-26: qty 15

## 2012-04-26 MED ORDER — ROCURONIUM BROMIDE 50 MG/5ML IV SOLN
INTRAVENOUS | Status: AC
  Filled 2012-04-26: qty 1

## 2012-04-26 MED ORDER — FENTANYL CITRATE 0.05 MG/ML IJ SOLN
INTRAMUSCULAR | Status: DC | PRN
  Administered 2012-04-26 (×3): 50 ug via INTRAVENOUS
  Administered 2012-04-26: 100 ug via INTRAVENOUS
  Administered 2012-04-26 (×2): 50 ug via INTRAVENOUS

## 2012-04-26 MED ORDER — ONDANSETRON HCL 4 MG/2ML IJ SOLN: 4.0000 mg | Freq: Four times a day (QID) | INTRAMUSCULAR | Status: DC | PRN

## 2012-04-26 MED ORDER — DEXAMETHASONE SODIUM PHOSPHATE 10 MG/ML IJ SOLN
INTRAMUSCULAR | Status: AC
  Filled 2012-04-26: qty 1

## 2012-04-26 MED ORDER — VASOPRESSIN 20 UNIT/ML IJ SOLN
INTRAMUSCULAR | Status: AC
  Filled 2012-04-26: qty 1

## 2012-04-26 MED ORDER — STERILE WATER FOR IRRIGATION IR SOLN
Status: DC | PRN
  Administered 2012-04-26: 1

## 2012-04-26 MED ORDER — HYDROMORPHONE 0.3 MG/ML IV SOLN
INTRAVENOUS | Status: DC
  Administered 2012-04-26: 1.2 mg via INTRAVENOUS
  Administered 2012-04-26: 17:00:00 via INTRAVENOUS
  Administered 2012-04-26: 1.2 mg via INTRAVENOUS
  Administered 2012-04-27 (×2): 0.6 mg via INTRAVENOUS
  Filled 2012-04-26: qty 25

## 2012-04-26 MED ORDER — LIDOCAINE HCL (CARDIAC) 20 MG/ML IV SOLN
INTRAVENOUS | Status: DC | PRN
  Administered 2012-04-26: 50 mg via INTRAVENOUS

## 2012-04-26 MED ORDER — INDIGOTINDISULFONATE SODIUM 8 MG/ML IJ SOLN
INTRAMUSCULAR | Status: AC
  Filled 2012-04-26: qty 5

## 2012-04-26 MED ORDER — LACTATED RINGERS IV SOLN
INTRAVENOUS | Status: DC
  Administered 2012-04-26 (×2): via INTRAVENOUS
  Administered 2012-04-26: 125 mL/h via INTRAVENOUS

## 2012-04-26 MED ORDER — METOCLOPRAMIDE HCL 5 MG/ML IJ SOLN
INTRAMUSCULAR | Status: DC | PRN
  Administered 2012-04-26: 10 mg via INTRAVENOUS

## 2012-04-26 MED ORDER — SODIUM CHLORIDE 0.9 % IJ SOLN: 9.0000 mL | INTRAMUSCULAR | Status: DC | PRN

## 2012-04-26 MED ORDER — MENTHOL 3 MG MT LOZG: 1.0000 | LOZENGE | OROMUCOSAL | Status: DC | PRN

## 2012-04-26 MED ORDER — IBUPROFEN 600 MG PO TABS: 600.0000 mg | ORAL_TABLET | Freq: Four times a day (QID) | ORAL | Status: DC | PRN

## 2012-04-26 MED ORDER — DIPHENHYDRAMINE HCL 50 MG/ML IJ SOLN: 12.5000 mg | Freq: Four times a day (QID) | INTRAMUSCULAR | Status: DC | PRN

## 2012-04-26 MED ORDER — METOCLOPRAMIDE HCL 5 MG/ML IJ SOLN: 10.0000 mg | Freq: Once | INTRAMUSCULAR | Status: DC | PRN

## 2012-04-26 MED ORDER — FENTANYL CITRATE 0.05 MG/ML IJ SOLN
INTRAMUSCULAR | Status: AC
  Filled 2012-04-26: qty 5

## 2012-04-26 MED ORDER — HYDROMORPHONE HCL PF 1 MG/ML IJ SOLN
INTRAMUSCULAR | Status: AC
  Administered 2012-04-26: 0.5 mg via INTRAVENOUS
  Filled 2012-04-26: qty 1

## 2012-04-26 MED ORDER — ONDANSETRON HCL 4 MG/2ML IJ SOLN
INTRAMUSCULAR | Status: AC
  Filled 2012-04-26: qty 2

## 2012-04-26 MED ORDER — NALOXONE HCL 0.4 MG/ML IJ SOLN: 0.4000 mg | INTRAMUSCULAR | Status: DC | PRN

## 2012-04-26 MED ORDER — CEFAZOLIN SODIUM-DEXTROSE 2-3 GM-% IV SOLR
INTRAVENOUS | Status: DC | PRN
  Administered 2012-04-26: 2 g via INTRAVENOUS

## 2012-04-26 MED ORDER — INFLUENZA VIRUS VACC SPLIT PF IM SUSP
0.5000 mL | INTRAMUSCULAR | Status: DC
  Filled 2012-04-26: qty 0.5

## 2012-04-26 MED ORDER — MEPERIDINE HCL 25 MG/ML IJ SOLN: 6.2500 mg | INTRAMUSCULAR | Status: DC | PRN

## 2012-04-26 MED ORDER — GLYCOPYRROLATE 0.2 MG/ML IJ SOLN
INTRAMUSCULAR | Status: DC | PRN
  Administered 2012-04-26: 0.1 mg via INTRAVENOUS

## 2012-04-26 MED ORDER — HYDROMORPHONE HCL PF 1 MG/ML IJ SOLN
INTRAMUSCULAR | Status: AC
  Filled 2012-04-26: qty 2

## 2012-04-26 MED ORDER — LIDOCAINE HCL (CARDIAC) 20 MG/ML IV SOLN
INTRAVENOUS | Status: AC
  Filled 2012-04-26: qty 5

## 2012-04-26 MED ORDER — MIDAZOLAM HCL 2 MG/2ML IJ SOLN
INTRAMUSCULAR | Status: AC
  Filled 2012-04-26: qty 2

## 2012-04-26 MED ORDER — IBUPROFEN 600 MG PO TABS
600.0000 mg | ORAL_TABLET | Freq: Four times a day (QID) | ORAL | Status: DC | PRN
  Administered 2012-04-26 – 2012-04-27 (×3): 600 mg via ORAL
  Filled 2012-04-26 (×3): qty 1

## 2012-04-26 MED ORDER — PROPOFOL 10 MG/ML IV EMUL
INTRAVENOUS | Status: AC
  Filled 2012-04-26: qty 20

## 2012-04-26 MED ORDER — SCOPOLAMINE 1 MG/3DAYS TD PT72
1.0000 | MEDICATED_PATCH | TRANSDERMAL | Status: DC
  Administered 2012-04-26: 1.5 mg via TRANSDERMAL

## 2012-04-26 MED ORDER — HYDROMORPHONE HCL PF 1 MG/ML IJ SOLN
INTRAMUSCULAR | Status: AC
  Filled 2012-04-26: qty 1

## 2012-04-26 MED ORDER — HYDROMORPHONE HCL PF 1 MG/ML IJ SOLN
INTRAMUSCULAR | Status: DC | PRN
  Administered 2012-04-26: 1 mg via INTRAVENOUS
  Administered 2012-04-26 (×2): 0.5 mg via INTRAVENOUS

## 2012-04-26 MED ORDER — LACTATED RINGERS IV SOLN
INTRAVENOUS | Status: DC
  Administered 2012-04-26 – 2012-04-27 (×2): via INTRAVENOUS

## 2012-04-26 MED ORDER — ROCURONIUM BROMIDE 100 MG/10ML IV SOLN
INTRAVENOUS | Status: DC | PRN
  Administered 2012-04-26: 40 mg via INTRAVENOUS
  Administered 2012-04-26: 10 mg via INTRAVENOUS

## 2012-04-26 MED ORDER — ESTRADIOL 0.1 MG/GM VA CREA
TOPICAL_CREAM | VAGINAL | Status: DC | PRN
  Administered 2012-04-26: 1 via VAGINAL

## 2012-04-26 MED ORDER — ONDANSETRON HCL 4 MG/2ML IJ SOLN
INTRAMUSCULAR | Status: DC | PRN
  Administered 2012-04-26: 4 mg via INTRAVENOUS

## 2012-04-26 MED ORDER — DIPHENHYDRAMINE HCL 12.5 MG/5ML PO ELIX: 12.5000 mg | ORAL_SOLUTION | Freq: Four times a day (QID) | ORAL | Status: DC | PRN

## 2012-04-26 SURGICAL SUPPLY — 54 items
BLADE SURG 11 STRL SS (BLADE) ×3 IMPLANT
BLADE SURG 15 STRL LF C SS BP (BLADE) ×2 IMPLANT
BLADE SURG 15 STRL SS (BLADE) ×1
CANISTER SUCTION 2500CC (MISCELLANEOUS) ×3 IMPLANT
CATH FOLEY 2WAY SLVR  5CC 18FR (CATHETERS) ×2
CATH FOLEY 2WAY SLVR 5CC 18FR (CATHETERS) ×4 IMPLANT
CLOTH BEACON ORANGE TIMEOUT ST (SAFETY) ×3 IMPLANT
CONT PATH 16OZ SNAP LID 3702 (MISCELLANEOUS) IMPLANT
DECANTER SPIKE VIAL GLASS SM (MISCELLANEOUS) ×3 IMPLANT
DERMABOND ADVANCED (GAUZE/BANDAGES/DRESSINGS) ×1
DERMABOND ADVANCED .7 DNX12 (GAUZE/BANDAGES/DRESSINGS) ×2 IMPLANT
DRAPE HYSTEROSCOPY (DRAPE) ×3 IMPLANT
DRAPE PROXIMA HALF (DRAPES) ×3 IMPLANT
DRAPE STERI URO 9X17 APER PCH (DRAPES) ×3 IMPLANT
DRESSING TELFA 8X3 (GAUZE/BANDAGES/DRESSINGS) IMPLANT
DRSG COVADERM PLUS 2X2 (GAUZE/BANDAGES/DRESSINGS) IMPLANT
GAUZE PACKING 2X5 YD STERILE (GAUZE/BANDAGES/DRESSINGS) ×3 IMPLANT
GAUZE SPONGE 4X4 16PLY XRAY LF (GAUZE/BANDAGES/DRESSINGS) ×12 IMPLANT
GLOVE BIO SURGEON STRL SZ7.5 (GLOVE) ×6 IMPLANT
GLOVE BIOGEL PI IND STRL 6.5 (GLOVE) ×6 IMPLANT
GLOVE BIOGEL PI IND STRL 7.0 (GLOVE) ×2 IMPLANT
GLOVE BIOGEL PI IND STRL 7.5 (GLOVE) ×8 IMPLANT
GLOVE BIOGEL PI INDICATOR 6.5 (GLOVE) ×3
GLOVE BIOGEL PI INDICATOR 7.0 (GLOVE) ×1
GLOVE BIOGEL PI INDICATOR 7.5 (GLOVE) ×4
GLOVE ECLIPSE 7.0 STRL STRAW (GLOVE) ×3 IMPLANT
GOWN PREVENTION PLUS LG XLONG (DISPOSABLE) ×9 IMPLANT
GOWN STRL REIN XL XLG (GOWN DISPOSABLE) ×3 IMPLANT
NEEDLE HYPO 22GX1.5 SAFETY (NEEDLE) ×3 IMPLANT
NEEDLE MAYO .5 CIRCLE (NEEDLE) ×3 IMPLANT
NEEDLE SPNL 22GX3.5 QUINCKE BK (NEEDLE) IMPLANT
NS IRRIG 1000ML POUR BTL (IV SOLUTION) ×3 IMPLANT
PACK VAGINAL WOMENS (CUSTOM PROCEDURE TRAY) ×3 IMPLANT
PAD OB MATERNITY 4.3X12.25 (PERSONAL CARE ITEMS) IMPLANT
SET CYSTO W/LG BORE CLAMP LF (SET/KITS/TRAYS/PACK) ×3 IMPLANT
SLING TRANS VAGINAL TAPE (Sling) ×1 IMPLANT
SLING UTERINE/ABD GYNECARE TVT (Sling) ×2 IMPLANT
SUT MNCRL AB 3-0 PS2 27 (SUTURE) IMPLANT
SUT MNCRL AB 4-0 PS2 18 (SUTURE) IMPLANT
SUT VIC AB 0 CT1 18XCR BRD8 (SUTURE) ×6 IMPLANT
SUT VIC AB 0 CT1 27 (SUTURE) ×1
SUT VIC AB 0 CT1 27XBRD ANBCTR (SUTURE) ×2 IMPLANT
SUT VIC AB 0 CT1 8-18 (SUTURE) ×3
SUT VIC AB 1 CT1 36 (SUTURE) IMPLANT
SUT VIC AB 2-0 CT1 27 (SUTURE)
SUT VIC AB 2-0 CT1 TAPERPNT 27 (SUTURE) IMPLANT
SUT VIC AB 2-0 SH 27 (SUTURE) ×25
SUT VIC AB 2-0 SH 27XBRD (SUTURE) ×50 IMPLANT
SUT VIC AB 3-0 SH 27 (SUTURE) ×2
SUT VIC AB 3-0 SH 27X BRD (SUTURE) ×4 IMPLANT
SUT VICRYL 0 TIES 12 18 (SUTURE) ×3 IMPLANT
SYR TB 1ML 25GX5/8 (SYRINGE) ×3 IMPLANT
TOWEL OR 17X24 6PK STRL BLUE (TOWEL DISPOSABLE) ×6 IMPLANT
TRAY FOLEY CATH 14FR (SET/KITS/TRAYS/PACK) ×3 IMPLANT

## 2012-04-26 NOTE — Anesthesia Postprocedure Evaluation (Signed)
  Anesthesia Post-op Note  Patient: Shannon Howell  Procedure(s) Performed: Procedure(s) (LRB) with comments: HYSTERECTOMY VAGINAL (N/A) - 4 hours ANTERIOR (CYSTOCELE) AND POSTERIOR REPAIR (RECTOCELE) (N/A) TRANSVAGINAL TAPE (TVT) PROCEDURE (N/A) CYSTOSCOPY (Bilateral)  Patient Location: PACU  Anesthesia Type: General  Level of Consciousness: awake, alert  and oriented  Airway and Oxygen Therapy: Patient Spontanous Breathing  Post-op Pain: mild  Post-op Assessment: Post-op Vital signs reviewed  Post-op Vital Signs: Reviewed and stable  Complications: No apparent anesthesia complications

## 2012-04-26 NOTE — Progress Notes (Signed)
Preop Diagnosis: Prolapse, Cystocele, and Rectocele   Postop Diagnosis: Prolapse, Cystocele, and Rectocele   Procedure: HYSTERECTOMY VAGINAL  POSTERIOR REPAIR (RECTOCELE) TRANSVAGINAL TAPE (TVT) PROCEDURE CYSTOSCOPY   Anesthesia: General   Anesthesiologist: Mal Amabile, MD  Attending: Purcell Nails, MD   Assistant: Henreitta Leber, PA-C  Findings: Large rectocele, minimal cystocele  Pathology: Uterus and cervix  Fluids: 3500cc  UOP: 500 +cc  EBL: 500  Complications: None  Procedure:The patient was taken to the operating room after the risks, benefits and alternatives were discussed with the patient. The patient verbalized understanding and consent signed and witnessed. The patient was placed under general anesthesia per the anesthesiologist and a timeout was performed per protocol. The patient was prepped and draped in the normal sterile fashion in the dorsal lithotomy position.  A weighted speculum was placed the patient's vagina and the anterior lip of the cervix was grasped with a single-tooth tenaculum and Dever retractors were placed for vaginal wall retraction. The cervix was circumscribed with the bovie after injecting the cervix with pitressin at a concentration of 20 units of Pitressin in 50 cc of normal saline.  Once the cervix was circumscribed the anterior cul-de-sac was entered without difficulty.  The posterior cul-de-sac was entered without difficulty as well.  Curved Heaney clamps were used to clamp the uterosacral and cardinal ligaments and the tissue was then cut and suture ligated using 0 Vicryl. This was done bilaterally and sequentially. The remaining parametrial tissue was clamped, cut and suture ligated using 0 Vicryl in a sequential and bilateral fashion as well.  The uterine fundus was exteriorized and the remaining pedicles were bilaterally clamped, cut and suture ligated with 0 vicryl.  The uterus and cervix were handed off to be sent to pathology.  The bilateral ovaries and fallopian tubes appeared to be within normal limits. The angles of the cuff were sutured using 0 vicryl.  The cuff was then repaired to the midline with figure-of-eight and interrupted stitches of 0 Vicryl.  The cuff was noted to be hemostatic.    The cystocele was identified and the anterior vaginal wall was injected with dilute Pitressin.  The anterior vaginal wall was then incised and the underlying tissue dissected away.  Excess tissue was excised and the anterior vaginal wall was repaired with a purse string stitch of 3-0 vicryl.  The anterior vaginal wall was then repaired using 2-0 Vicryl via interrupted stitches. The Foley was removed and cystoscopy was performed after administration of indigo carmine. Bilateral ureters were noted to eflux without difficulty and there were no inadvertent bladder injuries. The Foley was replaced to gravity.  Attention was then turned to the posterior vaginal wall where dilute Pitressin was injected. The posterior vaginal wall was incised and the underlying tissue was dissected away from the posterior vaginal wall. The rectocele was repaired using plication stitches of 2-0 Vicryl.  Excess tissue was excised and the posterior vaginal wall was repaired with 2-0 Vicryl via a running interlocking stitch after reinforcing the perineal body.  The perineum was repaired with 3-0 Vicryl via subcutaneous stitch.   The patient tolerated the procedure well and was returned to the recovery room in good condition.   Preop Diagnosis: SUI  Postop Diagnosis: SUI  Procedure:1.TVT 2. Cystoscopy  Complications:none  Procedure:The patient was taken to the operating room after the risks, benefits and alternatives were discussed with patient, the patient verbalized understanding and consent signed and witnessed. The patient was placed under general anesthesia and prepped and draped  in the normal sterile fashion in the dorsal lithotomy position. A weighted  speculum was placed in the patient's vagina and the anterior vaginal wall was injected with dilute pitressin at a concentration of 20 units of pitressin in a total of 100cc of normal saline.  An incision was made in the anterior wall of the vagina for approximately 1cm beneath the midurethra and the underlying tissue was dissected away from the anterior vaginal wall down to the level of the lower symphysis pubis bilaterally. Attention was then turned to the mons pubis where two 5 mm incisions were made 2 fingerbreadths from the midline. The transabdominal guide was then passed through the mons pubis incision on the patient's right down through the space of Retzius and out through the anterior vaginal wall after deflecting the rigid urethral catheter guide to the ipsilateral side. The same was done on the contralateral side. Cystoscopy was performed and no invadvertant bladder injury was noted. The bladder was drained with a Foley while deflecting the rigid urethral catheter guide to the patient's right and the mesh was attached to the transabdominal guide and elevated up through the space of Retzius and out through the incision on the mons pubis on the ipsilateral side. The same was done on the contralateral side. Cystoscopy was performed again and no inadvertant bladder injury was noted. The 64 French Foley was left in the urethra and a large Tresa Endo was placed between the urethra and the mesh in order to leave the mesh slack beneath the midurethra. The mesh was then cut flush with the skin at the mons pubis incisions bilaterally. Indigo carmine had been administered, cystoscopy was performed again and bilateral ureters were noted to efflux without difficulty. The bilateral incisions on the mons pubis were then cleaned and Dermabond applied. The anterior vaginal wall incision was repaired with 2-0 vicryl with interrupted stitches.  Vagina was packed with estrogen soaked vaginal packing.  Sponge, lap and needle count  was correct.  The patient tolerated the procedure well and was returned to the recovery room in good condition.

## 2012-04-26 NOTE — Anesthesia Preprocedure Evaluation (Signed)
Anesthesia Evaluation  Patient identified by MRN, date of birth, ID band Patient awake    Reviewed: Allergy & Precautions, H&P , NPO status , Patient's Chart, lab work & pertinent test results  History of Anesthesia Complications (+) PONV  Airway Mallampati: II TM Distance: >3 FB Neck ROM: Full    Dental No notable dental hx. (+) Teeth Intact   Pulmonary asthma ,  Exercise induced breath sounds clear to auscultation  Pulmonary exam normal       Cardiovascular negative cardio ROS  Rhythm:Regular Rate:Normal     Neuro/Psych negative neurological ROS  negative psych ROS   GI/Hepatic negative GI ROS, Neg liver ROS,   Endo/Other  Hypothyroidism   Renal/GU negative Renal ROS  negative genitourinary   Musculoskeletal   Abdominal   Peds  Hematology negative hematology ROS (+)   Anesthesia Other Findings   Reproductive/Obstetrics negative OB ROS                           Anesthesia Physical Anesthesia Plan  ASA: II  Anesthesia Plan: General   Post-op Pain Management:    Induction: Intravenous  Airway Management Planned: Oral ETT  Additional Equipment:   Intra-op Plan:   Post-operative Plan: Extubation in OR  Informed Consent: I have reviewed the patients History and Physical, chart, labs and discussed the procedure including the risks, benefits and alternatives for the proposed anesthesia with the patient or authorized representative who has indicated his/her understanding and acceptance.   Dental advisory given  Plan Discussed with: CRNA, Anesthesiologist and Surgeon  Anesthesia Plan Comments:         Anesthesia Quick Evaluation

## 2012-04-26 NOTE — Anesthesia Postprocedure Evaluation (Signed)
Anesthesia Post Note  Patient: Shannon Howell  Procedure(s) Performed: Procedure(s) (LRB): HYSTERECTOMY VAGINAL (N/A) ANTERIOR (CYSTOCELE) AND POSTERIOR REPAIR (RECTOCELE) (N/A) TRANSVAGINAL TAPE (TVT) PROCEDURE (N/A) CYSTOSCOPY (Bilateral)  Anesthesia type: General  Patient location: PACU  Post pain: Pain level controlled  Post assessment: Post-op Vital signs reviewed  Last Vitals:  Filed Vitals:   04/26/12 1430  BP: 112/60  Pulse: 80  Temp:   Resp: 13    Post vital signs: Reviewed  Level of consciousness: sedated  Complications: No apparent anesthesia complicationsfj

## 2012-04-26 NOTE — Progress Notes (Signed)
Day of Surgery Procedure(s) (LRB): HYSTERECTOMY VAGINAL (N/A) POSTERIOR REPAIR (RECTOCELE) (N/A) TRANSVAGINAL TAPE (TVT) PROCEDURE (N/A) CYSTOSCOPY (Bilateral)  Subjective: Patient reports no problems.     Objective: I have reviewed patient's vital signs and intake and output.  UOP 300cc/2hrs  General: alert and no distress Resp: clear to auscultation bilaterally Cardio: regular rate and rhythm GI: soft, non-tender; bowel sounds normal; no masses,  no organomegaly Extremities: Homans sign is negative, no sign of DVT Vaginal Bleeding: vaginal packing in place  Assessment: s/p Procedure(s) (LRB) with comments: HYSTERECTOMY VAGINAL (N/A) - 4 hours ANTERIOR (CYSTOCELE) AND POSTERIOR REPAIR (RECTOCELE) (N/A) TRANSVAGINAL TAPE (TVT) PROCEDURE (N/A) CYSTOSCOPY (Bilateral): stable  Plan: routine post op care Good UOP Labs in am SCDs to bil lower extremities Encourage IS  LOS: 0 days    Shannon Howell Y 04/26/2012, 7:04 PM

## 2012-04-26 NOTE — H&P (View-Only) (Deleted)
Shannon Howell is a 36 y.o. female [id number] who presents for a total vaginal hysterectomy, placement of tension free vaginal tape, anterior-posterior repair and cystoscopy because of symptomatic uterine prolapse and urinary incontinence. Since patient's first delivery, when she sustained a fourth degree sulcus tear, she reports feeling a bulge at her vaginal opening from time to time and leaking of urine with various activities. With squatting, running or jumping on a trampoline the patient will leak urine, especially when her bladder is full. She has to manipulate a tampon in order to place it properly within the vagina and digitally assist with evacuation of stool. There is also reported discomfort with intercourse due to the lowered position of her uterus. In June of this year the patient underwent cystometrics (Lumax testing) and though there was no significant findings of stress urinary incontinence, patient wants to proceed with surgical management for her clinical symptoms. A review of both medical and surgical management options were given to patient for all of her symptoms and after careful consideration she still chooses surgery.   Past Medical History   OB History: [id number] SVD: 2009 and 2012 (largest infant 9lbs, 3oz); History of fourth degree sulcus tear.   GYN History: menarche 36 YO; LMP 03/22/12 (patient has been mostly amenorrheic since being on Depo Provera); Contraception Depo Provera; The patient denies history of sexually transmitted disease. ; Denies history of abnormal PAP smear; Last PAP smear April 2012  Medical History: thyroid disease ([place]), asthma (activity induced); discoid lupus   Surgical History: 1989-Tonsillectomy; 1999 & 2000-Left & Right Knee Lateral Release; 2006-Septoplasty and 2007-Appendectomy  Denies history of blood transfusions and has severe nausea and vomiting with anesthesia   Family History: Atrial Fibrillation, thyroid disease and non-Hodgkins  Lymphoma   Social History: Married and works as a Gaffer; Uses alcohol socially but denies tobacco or illicit drug use; Patient is breastfeeding   Outpatient Encounter Prescriptions as of 04/18/2012   Medication  Sig  Dispense  Refill   .  Clobetasol Prop Emollient Base 0.05 % emollient cream  Apply topically 2 (two) times daily as needed. For Lupus rash.     .  levalbuterol (XOPENEX HFA) 45 MCG/ACT inhaler  Inhale 1-2 puffs into the lungs every 6 (six) hours as needed. For Asthma symptoms.     Marland Kitchen  levothyroxine (SYNTHROID, LEVOTHROID) 150 MCG tablet  Take 175 mcg by mouth daily.     .  medroxyPROGESTERone (DEPO-PROVERA) 150 MG/ML injection  Inject 150 mg into the muscle every 3 (three) months.     .  Prenatal Vit-Fe Fumarate-FA (MULTIVITAMIN-PRENATAL) 27-0.8 MG TABS  Take 1 tablet by mouth daily.     .  cephALEXin (KEFLEX) 500 MG capsule  Take 500 mg by mouth 4 (four) times daily.     Marland Kitchen  ibuprofen (ADVIL,MOTRIN) 600 MG tablet  Take 600 mg by mouth every 6 (six) hours as needed.     .  traMADol (ULTRAM) 50 MG tablet  Take 50 mg by mouth every 6 (six) hours as needed.      Allergies   Allergen  Reactions   .  Penicillins  Swelling     Childhood Rxn. Can take Keflex ok.    Denies sensitivity to latex, soy, shellfish, peanuts or adhesives   ROS: Admits to contact lenses, seasonal allergies and occasional Lupus rash; Denies headache, vision changes, dysphagia, tinnitus, dizziness, chest pain, shortness of breath, nausea, vomiting, diarrhea, dysuria, hematuria, pelvic pain, swelling of joints,easy bruising, myalgias, arthralgias and  except as is mentioned in the history of present illness, patient's review of systems is otherwise negative.   Physical Exam   BP 110/72  Pulse 62  Temp 99.6 F (37.6 C) (Oral)  Resp 14  Ht 5' 10.5" (1.791 m)  Wt 156 lb (70.761 kg)  BMI 22.07 kg/m2   Neck: supple without masses or thyromegaly  Lungs: clear to auscultation  Heart: regular rate and  rhythm  Abdomen: soft, non-tender and no organomegaly  Pelvic:EGBUS- wnl; vagina-3/4 cystocele, 2/4 rectocele; uterus-normal size and descended half the length of vagina, cervix without lesions or motion tenderness; adnexae-no tenderness or masses  Extremities: no clubbing, cyanosis or edema   Assesment: Symptomatic Uterine Prolapse                        Urinary Incontinence    Disposition: A discussion was held with patient regarding the indication for her procedure(s) along with the risks, which include but are not limited to: reaction to anesthesia, damage to adjacent organs, infection, excessive bleeding, worsening of urinary symptoms, pelvic prolapse, early menopause and erosion of mesh. Patient verbalized understanding of these risks and has consented to proceed with a Total Vaginal Hysterectomy, Anterior-Posterior Repair, Placement of Tension Free Vaginal Tape and Cystoscopy at [facility] on April 26, 2012 at 9:30 a.m.   CSN# [id number]   Murl Zogg J. Lowell Guitar, PA-C for Dr. Woodroe Mode. Su Hilt

## 2012-04-26 NOTE — Transfer of Care (Signed)
Immediate Anesthesia Transfer of Care Note  Patient: Shannon Howell  Procedure(s) Performed: Procedure(s) (LRB) with comments: HYSTERECTOMY VAGINAL (N/A) - 4 hours ANTERIOR (CYSTOCELE) AND POSTERIOR REPAIR (RECTOCELE) (N/A) TRANSVAGINAL TAPE (TVT) PROCEDURE (N/A) CYSTOSCOPY (Bilateral)  Patient Location: PACU  Anesthesia Type: General  Level of Consciousness: awake, alert  and oriented  Airway & Oxygen Therapy: Patient Spontanous Breathing and Patient connected to nasal cannula oxygen  Post-op Assessment: Report given to PACU RN and Post -op Vital signs reviewed and stable  Post vital signs: Reviewed and stable  Complications: No apparent anesthesia complications

## 2012-04-26 NOTE — Addendum Note (Signed)
Addendum  created 04/26/12 1651 by Henretter Piekarski M Chinmay Squier, RN   Modules edited:Notes Section    

## 2012-04-26 NOTE — Anesthesia Procedure Notes (Signed)
Procedure Name: Intubation Date/Time: 04/26/2012 9:45 AM Performed by: Graciela Husbands Pre-anesthesia Checklist: Suction available, Emergency Drugs available, Timeout performed, Patient identified and Patient being monitored Patient Re-evaluated:Patient Re-evaluated prior to inductionOxygen Delivery Method: Circle system utilized Preoxygenation: Pre-oxygenation with 100% oxygen Intubation Type: IV induction Ventilation: Mask ventilation without difficulty Laryngoscope Size: Mac and 3 Grade View: Grade I Tube type: Oral Tube size: 7.0 mm Number of attempts: 1 Airway Equipment and Method: Stylet Placement Confirmation: ETT inserted through vocal cords under direct vision,  breath sounds checked- equal and bilateral and positive ETCO2 Secured at: 22 cm Tube secured with: Tape Dental Injury: Teeth and Oropharynx as per pre-operative assessment

## 2012-04-26 NOTE — Interval H&P Note (Deleted)
History and Physical Interval Note:  04/26/2012 9:24 AM  Shannon Howell  has presented today for surgery, with the diagnosis of Prolapse, Cystocele, Rectocele  The various methods of treatment have been discussed with the patient and family. After consideration of risks, benefits and other options for treatment, the patient has consented to  Procedure(s) (LRB) with comments: HYSTERECTOMY VAGINAL (N/A) - 4 hours ANTERIOR (CYSTOCELE) AND POSTERIOR REPAIR (RECTOCELE) (N/A) TRANSVAGINAL TAPE (TVT) PROCEDURE (N/A) CYSTOSCOPY (Bilateral) as a surgical intervention .  The patient's history has been reviewed, patient examined, no change in status, stable for surgery.  I have reviewed the patient's chart and labs.  Questions were answered to the patient's satisfaction.     Purcell Nails

## 2012-04-27 ENCOUNTER — Encounter (HOSPITAL_COMMUNITY): Payer: Self-pay | Admitting: Obstetrics and Gynecology

## 2012-04-27 LAB — CBC
HCT: 29.6 % — ABNORMAL LOW (ref 36.0–46.0)
Hemoglobin: 9.7 g/dL — ABNORMAL LOW (ref 12.0–15.0)
RBC: 3.19 MIL/uL — ABNORMAL LOW (ref 3.87–5.11)
WBC: 6.6 10*3/uL (ref 4.0–10.5)

## 2012-04-27 MED ORDER — CIPROFLOXACIN HCL 500 MG PO TABS: 500.0000 mg | ORAL_TABLET | Freq: Two times a day (BID) | ORAL | Status: AC

## 2012-04-27 MED ORDER — TRAMADOL HCL 50 MG PO TABS: 50.0000 mg | ORAL_TABLET | Freq: Four times a day (QID) | ORAL | Status: DC | PRN

## 2012-04-27 MED ORDER — ONDANSETRON HCL 4 MG PO TABS: 4.0000 mg | ORAL_TABLET | Freq: Three times a day (TID) | ORAL | Status: AC | PRN

## 2012-04-27 NOTE — Progress Notes (Signed)
Pt   Ambulated  Out   Teaching complete  

## 2012-04-27 NOTE — Op Note (Signed)
Preop Diagnosis: Prolapse, Cystocele, and Rectocele   Postop Diagnosis: Prolapse, Cystocele, and Rectocele   Procedure: HYSTERECTOMY VAGINAL  POSTERIOR REPAIR (RECTOCELE) TRANSVAGINAL TAPE (TVT) PROCEDURE CYSTOSCOPY   Anesthesia: General   Anesthesiologist: Mal Amabile, MD  Attending: Purcell Nails, MD   Assistant: Henreitta Leber, PA-C  Findings: Large rectocele, minimal cystocele and uterine prolapse.  Additional dose of 2g Ancef given secondary to increased EBL and operating time.  Pathology: Uterus and cervix  Fluids: 3500cc  UOP: 500 +cc  EBL: 500  Complications: None  Procedure:The patient was taken to the operating room after the risks, benefits and alternatives were discussed with the patient. The patient verbalized understanding and consent signed and witnessed. The patient was placed under general anesthesia per the anesthesiologist and a timeout was performed per protocol. The patient was prepped and draped in the normal sterile fashion in the dorsal lithotomy position.  A weighted speculum was placed the patient's vagina and the anterior lip of the cervix was grasped with a single-tooth tenaculum and Dever retractors were placed for vaginal wall retraction. The cervix was circumscribed with the bovie after injecting the cervix with pitressin at a concentration of 20 units of Pitressin in 50 cc of normal saline.  Once the cervix was circumscribed the anterior cul-de-sac was entered after entering the posterior cul-de-sac without difficulty.  Curved Heaney clamps were used to clamp the uterosacral and cardinal ligaments and the tissue was then cut and suture ligated using 0 Vicryl. This was done bilaterally and sequentially. The remaining parametrial tissue was clamped, cut and suture ligated using 0 Vicryl in a sequential and bilateral fashion as well.  The uterine fundus was exteriorized and the remaining pedicles were bilaterally clamped, cut and suture ligated  with 0 vicryl.  The uterus and cervix were handed off to be sent to pathology.  The angles of the cuff were sutured using 0 vicryl and a McCall culdoplasty stitch placed.  The cuff was then repaired to the midline with figure-of-eight and interrupted stitches of 0 Vicryl.  The cuff was noted to be hemostatic.     A weighted speculum was placed in the patient's vagina and the anterior vaginal wall was injected with dilute pitressin at a concentration of 20 units of pitressin in a total of 100cc of normal saline.  An incision was made in the anterior wall of the vagina for approximately 1cm beneath the midurethra and the underlying tissue was dissected away from the anterior vaginal wall down to the level of the lower symphysis pubis bilaterally. Attention was then turned to the mons pubis where two 5 mm incisions were made 2 fingerbreadths from the midline. The transabdominal guide was then passed through the mons pubis incision on the patient's right down through the space of Retzius and out through the anterior vaginal wall after deflecting the rigid urethral catheter guide to the ipsilateral side. The same was done on the contralateral side. Cystoscopy was performed and no invadvertant bladder injury was noted. The bladder was drained with a Foley while deflecting the rigid urethral catheter guide to the patient's right and the mesh was attached to the transabdominal guide and elevated up through the space of Retzius and out through the incision on the mons pubis on the ipsilateral side. The same was done on the contralateral side. Cystoscopy was performed again and no inadvertant bladder injury was noted. The 80 French Foley was left in the urethra and a large Tresa Endo was placed between the urethra and the  mesh in order to leave the mesh slack beneath the midurethra. The mesh was then cut flush with the skin at the mons pubis incisions bilaterally. Indigo carmine had been administered, cystoscopy was performed  again and bilateral ureters were noted to efflux without difficulty. The bilateral incisions on the mons pubis were then cleaned and Dermabond applied. The anterior vaginal wall incision was repaired with 2-0 vicryl with interrupted stitches.    Attention was then turned to the posterior vaginal wall where dilute Pitressin was injected. The posterior vaginal wall was incised and the underlying tissue was dissected away from the posterior vaginal wall. The rectocele was repaired using plication stitches of 2-0 Vicryl.  Excess tissue was excised and the posterior vaginal wall was repaired with 2-0 Vicryl via a running interlocking stitch after reinforcing the perineal body.  The perineum was repaired with 3-0 Vicryl via subcutaneous stitch.   Vagina was packed with estrogen soaked vaginal packing.  Sponge, lap and needle count was correct.  The patient tolerated the procedure well and was returned to the recovery room in good condition.

## 2012-04-27 NOTE — Progress Notes (Signed)
Shannon Howell is a35 y.o.  [id number]  Post Op Date # 1  TVH/TVT/Posterior Colporrhaphy/Cystoscopy  Subjective: Patient is Doing well postoperatively. Patient has The patient is not having any pain., and is ambulating without difficulty.   Tolerated a regular diet last night but hasn't voided since Foley has been removed.  Objective: Vital signs in last 24 hours: Temp:  [98.1 F (36.7 C)-99.1 F (37.3 C)] 98.1 F (36.7 C) (09/12 0607) Pulse Rate:  [58-80] 58  (09/12 0607) Resp:  [12-18] 12  (09/12 0607) BP: (105-122)/(57-83) 113/69 mmHg (09/12 0607) SpO2:  [95 %-100 %] 100 % (09/12 0607) Weight:  [155 lb (70.308 kg)] 155 lb (70.308 kg) (09/11 1600)  Intake/Output from previous day: 09/11 0701 - 09/12 0700 In: 6306.3 [P.O.:960; I.V.:5346.3] Out: 3625 [Urine:3125] Intake/Output this shift:   No results found for this basename: WBC:3,HGB:3,HCT:3,PLT:3 in the last 168 hours  No results found for this basename: NA:3,K:3,CL:3,CO2:3,BUN:3,CREATININE:3,CALCIUM:3,LABALBU:3,PROT:3,BILITOT:3,ALKPHOS:3,ALT:3,AST:3,GLUCOSE:3 in the last 168 hours  EXAM: General: alert, cooperative and no distress Resp: clear to auscultation bilaterally Cardio: regular rate and rhythm, S1, S2 normal, no murmur, click, rub or gallop GI: Bowel sounds present, soft and appropriately tender without guarding or rebound Extremities: No calf tenderness, negative Homan's without any other signs of DVT Vaginal Bleeding: minimal Mons incisions intact without evidence of infection   Assessment: s/p Procedure(s): HYSTERECTOMY VAGINAL ANTERIOR (CYSTOCELE) AND POSTERIOR REPAIR (RECTOCELE) TRANSVAGINAL TAPE (TVT) PROCEDURE CYSTOSCOPY: stable and progressing well  Plan: Encourage ambulation Advance to PO medication CBC-pending Discharge home later today once patient voids and has good pain control on oral medications  LOS: 1 day    Conner Neiss, PA-C 04/27/2012 7:19 AM

## 2012-04-27 NOTE — H&P (Signed)
Shannon Howell is a 36 y.o. female G2P2002 who presents for a total vaginal hysterectomy, placement of tension free vaginal tape, anterior-posterior repair and cystoscopy because of symptomatic uterine prolapse and urinary incontinence. Since patient's first delivery, when she sustained a fourth degree sulcus tear, she reports feeling a bulge at her vaginal opening from time to time and leaking of urine with various activities. With squatting, running or jumping on a trampoline the patient will leak urine, especially when her bladder is full. She has to manipulate a tampon in order to place it properly within the vagina and digitally assist with evacuation of stool. There is also reported discomfort with intercourse due to the lowered position of her uterus. In June of this year the patient underwent cystometrics (Lumax testing) and though there was no significant findings of stress urinary incontinence, patient wants to proceed with surgical management for her clinical symptoms. A review of both medical and surgical management options were given to patient for all of her symptoms and after careful consideration she still chooses surgery.   Past Medical History   OB History: G2P2002 SVD: 2009 and 2012 (largest infant 9lbs, 3oz); History of fourth degree sulcus tear.   GYN History: menarche 36 YO; LMP 03/22/12 (patient has been mostly amenorrheic since being on Depo Provera); Contraception Depo Provera; The patient denies history of sexually transmitted disease. 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  Clobetasol Prop Emollient Base 0.05 % emollient cream  Apply topically 2 (two) times daily as needed. For Lupus rash.     .  levalbuterol (XOPENEX HFA) 45 MCG/ACT inhaler  Inhale 1-2 puffs into the lungs every 6 (six) hours as needed. For Asthma symptoms.     .  levothyroxine (SYNTHROID, LEVOTHROID) 150 MCG tablet  Take 175 mcg by mouth daily.     .  medroxyPROGESTERone (DEPO-PROVERA) 150 MG/ML injection  Inject 150 mg into the muscle every 3 (three) months.     .  Prenatal Vit-Fe Fumarate-FA (MULTIVITAMIN-PRENATAL) 27-0.8 MG TABS  Take 1 tablet by mouth daily.     .  cephALEXin (KEFLEX) 500 MG capsule  Take 500 mg by mouth 4 (four) times daily.     .  ibuprofen (ADVIL,MOTRIN) 600 MG tablet  Take 600 mg by mouth every 6 (six) hours as needed.     .  traMADol (ULTRAM) 50 MG tablet  Take 50 mg by mouth every 6 (six) hours as needed.      Allergies   Allergen  Reactions   .  Penicillins  Swelling     Childhood Rxn. Can take Keflex ok.    Denies sensitivity to latex, soy, shellfish, peanuts or adhesives   ROS: Admits to contact lenses, seasonal allergies and occasional Lupus rash; Denies headache, vision changes, dysphagia, tinnitus, dizziness, chest pain, shortness of breath, nausea, vomiting, diarrhea, dysuria, hematuria, pelvic pain, swelling of joints,easy bruising, myalgias, arthralgias and   except as is mentioned in the history of present illness, patient's review of systems is otherwise negative.   Physical Exam   BP 110/72  Pulse 62  Temp 99.6 F (37.6 C) (Oral)  Resp 14  Ht 5' 10.5" (1.791 m)  Wt 156 lb (70.761 kg)  BMI 22.07 kg/m2   Neck: supple without masses or thyromegaly  Lungs: clear to auscultation  Heart: regular rate and  rhythm  Abdomen: soft, non-tender and no organomegaly  Pelvic:EGBUS- wnl; vagina-3/4 cystocele, 2/4 rectocele; uterus-normal size and descended half the length of vagina, cervix without lesions or motion tenderness; adnexae-no tenderness or masses  Extremities: no clubbing, cyanosis or edema   Assesment: Symptomatic Uterine Prolapse                        Urinary Incontinence    Disposition: A discussion was held with patient regarding the indication for her procedure(s) along with the risks, which include but are not limited to: reaction to anesthesia, damage to adjacent organs, infection, excessive bleeding, worsening of urinary symptoms, pelvic prolapse, early menopause and erosion of mesh. Patient verbalized understanding of these risks and has consented to proceed with a Total Vaginal Hysterectomy, Anterior-Posterior Repair, Placement of Tension Free Vaginal Tape and Cystoscopy at [facility] on April 26, 2012 at 9:30 a.m.   CSN# [id number]   Gabriele Loveland J. Aryel Edelen, PA-C for Dr. Angela Y. Roberts     

## 2012-04-27 NOTE — Discharge Summary (Signed)
Physician Discharge Summary  Patient ID: Shannon Howell MRN: [id number] DOB/AGE: Apr 04, 1976 35 y.o.  Admit date: 04/26/2012 Discharge date: 04/27/2012   Discharge Diagnoses: Uterine Prolapse and Urinary Incontinence Active Problems:  * No active hospital problems. *    Operation:  Total Vaginal Hysterectomy, Placement of Tension free Vaginal Tape, Cystoscopy and Posterior Colporrhaphy    Discharged Condition: Stable  Hospital Course: On the date of admission the patient underwent the aforementioned procedures, tolerating them well.  Post operative course was unremarkable with patient resuming bowel and bladder function by post operative day #1 and was therefore deemed ready for discharge home.  Disposition: 01-Home or Self Care  Discharge Medications:   Katya, [place]  Home Medication Instructions [id number]   Printed on:04/27/12 0741  Medication Information                    ibuprofen (ADVIL,MOTRIN) 600 MG tablet Take 600 mg by mouth every 6 (six) hours as needed.           traMADol (ULTRAM) 50 MG tablet Take 50 mg by mouth every 6 (six) hours as needed.           Prenatal Vit-Fe Fumarate-FA (MULTIVITAMIN-PRENATAL) 27-0.8 MG TABS Take 1 tablet by mouth daily.           Clobetasol Prop Emollient Base 0.05 % emollient cream Apply topically 2 (two) times daily as needed. For Lupus rash.           levalbuterol (XOPENEX HFA) 45 MCG/ACT inhaler Inhale 1-2 puffs into the lungs every 6 (six) hours as needed. For Asthma symptoms.           levothyroxine (SYNTHROID, LEVOTHROID) 175 MCG tablet Take 175 mcg by mouth daily.           ondansetron (ZOFRAN) 4 MG tablet Take 1 tablet (4 mg total) by mouth every 8 (eight) hours as needed for nausea.           traMADol (ULTRAM) 50 MG tablet Take 1 tablet (50 mg total) by mouth every 6 (six) hours as needed for pain.           ciprofloxacin (CIPRO) 500 MG tablet Take 1 tablet (500 mg total) by mouth 2 (two) times  daily.              Follow-up:  Dr. Su Hilt,  June 07, 2012 at 8:45 a.m.  SignedHenreitta Leber, PA-C 04/27/2012, 7:41 AM

## 2012-05-08 ENCOUNTER — Ambulatory Visit (INDEPENDENT_AMBULATORY_CARE_PROVIDER_SITE_OTHER): Payer: BC Managed Care – PPO | Admitting: Obstetrics and Gynecology

## 2012-05-08 ENCOUNTER — Encounter: Payer: Self-pay | Admitting: Obstetrics and Gynecology

## 2012-05-08 VITALS — BP 106/70 | Temp 97.4°F | Ht 70.0 in

## 2012-05-08 DIAGNOSIS — Z90710 Acquired absence of both cervix and uterus: Secondary | ICD-10-CM

## 2012-05-08 DIAGNOSIS — Z139 Encounter for screening, unspecified: Secondary | ICD-10-CM

## 2012-05-08 NOTE — Addendum Note (Signed)
Addended by: Marla Roe A on: 05/08/2012 04:42 PM   Modules accepted: Orders

## 2012-05-08 NOTE — Progress Notes (Signed)
  DATE OF SURGERY: 04/26/2012 TYPE OF SURGERY:TVH/TVT A&P repair.  PAIN:No VAG BLEEDING: no VAG DISCHARGE: yes/ wnl/ nothing unusual NORMAL GI FUNCTN: yes NORMAL GU FUNCTN: yes, with stool softeners  Reports urine comes out slower but no other symptoms, no leaking and feels like she empties completely.  Filed Vitals:   05/08/12 1459  BP: 106/70  Temp: 97.4 F (36.3 C)   ROS: noncontributory  Pelvic exam:  VULVA: normal appearing vulva with no masses, tenderness or lesions,  VAGINA: normal appearing vagina with normal color and discharge, no lesions, healing well ADNEXA: normal adnexa in size, nontender and no masses.  A/P Note to return to work Thurs 9/26 UCx Keep 6wk post op appt Pt and husband are separating

## 2012-05-10 ENCOUNTER — Telehealth: Payer: Self-pay

## 2012-05-10 NOTE — Telephone Encounter (Signed)
Spoke to pt to let her know about UTI. Needs tx w/ Bactrim DS 1 po bid x 7 days. #14 0 RF's It was called to [facility]. Melody Comas A

## 2012-05-11 ENCOUNTER — Telehealth: Payer: Self-pay

## 2012-05-11 LAB — URINE CULTURE: Colony Count: >=100000

## 2012-05-11 NOTE — Telephone Encounter (Signed)
Called Macrobid to pt's pharmacy today, since pt's infection will not respond to the Bactrim DS. Pt was notified. JO, CMA

## 2012-06-07 ENCOUNTER — Encounter: Payer: Self-pay | Admitting: Obstetrics and Gynecology

## 2012-06-07 ENCOUNTER — Ambulatory Visit (INDEPENDENT_AMBULATORY_CARE_PROVIDER_SITE_OTHER): Payer: BC Managed Care – PPO | Admitting: Obstetrics and Gynecology

## 2012-06-07 VITALS — BP 110/62 | Ht 70.0 in | Wt 152.0 lb

## 2012-06-07 DIAGNOSIS — Z90710 Acquired absence of both cervix and uterus: Secondary | ICD-10-CM

## 2012-06-07 NOTE — Progress Notes (Signed)
DATE OF SURGERY: 04/26/2012 TYPE OF SURGERY:TVH, A&P repair, TVT PAIN:No VAG BLEEDING: no VAG DISCHARGE: yes NORMAL GI FUNCTN: yes NORMAL GU FUNCTN: yes  Still voids slowly but no leaking with activity or cough.  Currently being treated for UTI.  Filed Vitals:   06/07/12 0904  BP: 110/62   ROS: noncontributory  Pelvic exam:  VULVA: normal appearing vulva with no masses, tenderness or lesions,  VAGINA: normal appearing vagina with normal color and discharge, no lesions, CERVIX: normal appearing cervix without discharge or lesions,   Path - benign  A/P S/p TVT,TVH,A-P repair doing well Informed she may always void more slowly but she says she is emptying completely and overall is happy with results AEX in 30yr

## 2013-03-13 ENCOUNTER — Emergency Department (HOSPITAL_BASED_OUTPATIENT_CLINIC_OR_DEPARTMENT_OTHER): Payer: Managed Care, Other (non HMO)

## 2013-03-13 ENCOUNTER — Observation Stay (HOSPITAL_BASED_OUTPATIENT_CLINIC_OR_DEPARTMENT_OTHER)
Admission: EM | Admit: 2013-03-13 | Discharge: 2013-03-14 | Disposition: A | Discharge status: Home / Self Care | Payer: Managed Care, Other (non HMO) | Attending: Obstetrics and Gynecology | Admitting: Obstetrics and Gynecology

## 2013-03-13 ENCOUNTER — Inpatient Hospital Stay (HOSPITAL_COMMUNITY): Payer: Managed Care, Other (non HMO)

## 2013-03-13 ENCOUNTER — Encounter (HOSPITAL_BASED_OUTPATIENT_CLINIC_OR_DEPARTMENT_OTHER): Payer: Self-pay | Admitting: *Deleted

## 2013-03-13 DIAGNOSIS — B9689 Other specified bacterial agents as the cause of diseases classified elsewhere: Secondary | ICD-10-CM | POA: Insufficient documentation

## 2013-03-13 DIAGNOSIS — K5900 Constipation, unspecified: Secondary | ICD-10-CM | POA: Insufficient documentation

## 2013-03-13 DIAGNOSIS — N949 Unspecified condition associated with female genital organs and menstrual cycle: Secondary | ICD-10-CM | POA: Insufficient documentation

## 2013-03-13 DIAGNOSIS — R109 Unspecified abdominal pain: Secondary | ICD-10-CM | POA: Insufficient documentation

## 2013-03-13 DIAGNOSIS — N83209 Unspecified ovarian cyst, unspecified side: Principal | ICD-10-CM | POA: Insufficient documentation

## 2013-03-13 DIAGNOSIS — N760 Acute vaginitis: Secondary | ICD-10-CM | POA: Insufficient documentation

## 2013-03-13 DIAGNOSIS — N739 Female pelvic inflammatory disease, unspecified: Secondary | ICD-10-CM

## 2013-03-13 DIAGNOSIS — A499 Bacterial infection, unspecified: Secondary | ICD-10-CM | POA: Insufficient documentation

## 2013-03-13 DIAGNOSIS — N731 Chronic parametritis and pelvic cellulitis: Secondary | ICD-10-CM | POA: Insufficient documentation

## 2013-03-13 DIAGNOSIS — Z90710 Acquired absence of both cervix and uterus: Secondary | ICD-10-CM | POA: Insufficient documentation

## 2013-03-13 LAB — CBC WITH DIFFERENTIAL/PLATELET
Eosinophils Absolute: 0.1 10*3/uL (ref 0.0–0.7)
Lymphs Abs: 2.5 10*3/uL (ref 0.7–4.0)
MCH: 30 pg (ref 26.0–34.0)
Neutrophils Relative %: 64 % (ref 43–77)
Platelets: 215 10*3/uL (ref 150–400)
RBC: 3.73 MIL/uL — ABNORMAL LOW (ref 3.87–5.11)
WBC: 9 10*3/uL (ref 4.0–10.5)

## 2013-03-13 LAB — URINALYSIS, ROUTINE W REFLEX MICROSCOPIC
Glucose, UA: NEGATIVE mg/dL
Leukocytes, UA: NEGATIVE
Nitrite: NEGATIVE
Protein, ur: NEGATIVE mg/dL
Urobilinogen, UA: 0.2 mg/dL (ref 0.0–1.0)

## 2013-03-13 LAB — COMPREHENSIVE METABOLIC PANEL
ALT: 10 U/L (ref 0–35)
AST: 14 U/L (ref 0–37)
Albumin: 3.8 g/dL (ref 3.5–5.2)
Calcium: 9.7 mg/dL (ref 8.4–10.5)
Sodium: 137 mEq/L (ref 135–145)
Total Protein: 7.4 g/dL (ref 6.0–8.3)

## 2013-03-13 LAB — WET PREP, GENITAL: Trich, Wet Prep: NONE SEEN

## 2013-03-13 MED ORDER — IOHEXOL 300 MG/ML  SOLN
100.0000 mL | Freq: Once | INTRAMUSCULAR | Status: AC | PRN
  Administered 2013-03-13: 100 mL via INTRAVENOUS

## 2013-03-13 MED ORDER — IOHEXOL 300 MG/ML  SOLN
50.0000 mL | Freq: Once | INTRAMUSCULAR | Status: AC | PRN
  Administered 2013-03-13: 50 mL via ORAL

## 2013-03-13 MED ORDER — METRONIDAZOLE IN NACL 5-0.79 MG/ML-% IV SOLN
500.0000 mg | Freq: Two times a day (BID) | INTRAVENOUS | Status: DC
  Administered 2013-03-14 (×2): 500 mg via INTRAVENOUS
  Filled 2013-03-13 (×3): qty 100

## 2013-03-13 MED ORDER — SODIUM CHLORIDE 0.9 % IV BOLUS (SEPSIS)
1000.0000 mL | Freq: Once | INTRAVENOUS | Status: AC
  Administered 2013-03-13: 1000 mL via INTRAVENOUS

## 2013-03-13 MED ORDER — BUTORPHANOL TARTRATE 1 MG/ML IJ SOLN: 1.0000 mg | INTRAMUSCULAR | Status: DC | PRN

## 2013-03-13 MED ORDER — LACTATED RINGERS IV SOLN
INTRAVENOUS | Status: DC
  Administered 2013-03-14 (×2): via INTRAVENOUS

## 2013-03-13 NOTE — ED Provider Notes (Signed)
CSN: [id number]     Arrival date & time 03/13/13  1800 History     First MD Initiated Contact with Patient 03/13/13 1836     Chief Complaint  Patient presents with  . Abdominal Pain   (Consider location/radiation/quality/duration/timing/severity/associated sxs/prior Treatment) Patient is a 38 y.o. female presenting with abdominal pain.  Abdominal Pain Associated symptoms include abdominal pain.   Pt with history of hysterectomy and pelvic reconstruction reports several days of increasing diffuse abdominal pain, bloating and anorexia. No vomiting, no BM. She thought it was gas and so she took Gas-X without relief. She then thought she may be constipated so she took enema today without improvement. Denies dysuria, but pain is worse after urinating. She went to PCP office where she had labs and plain film which were unremarkable. Send to the ED for further eval.  Past Medical History  Diagnosis Date  . H/O scoliosis   . History of chicken pox   . UTI (lower urinary tract infection)   . Asthma     activity induced  . H/O thyroid disease     hashimotos  . History of lupus   . Female cystocele   . Rectocele, female   . Thyroid disease   . Systemic lupus erythematosus   . PONV (postoperative nausea and vomiting)   . Hypothyroidism   . SVD (spontaneous vaginal delivery)     x 2   Past Surgical History  Procedure Laterality Date  . Septoplasty  2008  . Knee open lateral release  1995 & 1996    right and left  . Wisdom tooth extraction  1993  . Tonsillectomy    . Appendectomy  2008  . Vaginal hysterectomy  04/26/2012    Procedure: HYSTERECTOMY VAGINAL;  Surgeon: Purcell Nails, MD;  Location: [facility];  Service: Gynecology;  Laterality: N/A;  4 hours  . Anterior and posterior repair  04/26/2012    Procedure: ANTERIOR (CYSTOCELE) AND POSTERIOR REPAIR (RECTOCELE);  Surgeon: Purcell Nails, MD;  Location: [facility];  Service: Gynecology;  Laterality: N/A;  . Bladder suspension   04/26/2012    Procedure: TRANSVAGINAL TAPE (TVT) PROCEDURE;  Surgeon: Purcell Nails, MD;  Location: [facility];  Service: Gynecology;  Laterality: N/A;  . Cystoscopy  04/26/2012    Procedure: CYSTOSCOPY;  Surgeon: Purcell Nails, MD;  Location: [facility];  Service: Gynecology;  Laterality: Bilateral;   Family History  Problem Relation Age of Onset  . Heart disease Mother     af   History  Substance Use Topics  . Smoking status: Former Smoker -- 0.10 packs/day    Types: Cigarettes  . Smokeless tobacco: Former Neurosurgeon    Quit date: 08/16/2001  . Alcohol Use: Yes     Comment: social   OB History   Grav Para Term Preterm Abortions TAB SAB Ect Mult Living   2 2 2       2      Review of Systems  Gastrointestinal: Positive for abdominal pain.   All other systems reviewed and are negative except as noted in HPI.   Allergies  Penicillins  Home Medications   Current Outpatient Rx  Name  Route  Sig  Dispense  Refill  . Clobetasol Prop Emollient Base 0.05 % emollient cream   Topical   Apply topically 2 (two) times daily as needed. For Lupus rash.         . ibuprofen (ADVIL,MOTRIN) 600 MG tablet   Oral   Take 600  mg by mouth every 6 (six) hours as needed.         . levalbuterol (XOPENEX HFA) 45 MCG/ACT inhaler   Inhalation   Inhale 1-2 puffs into the lungs every 6 (six) hours as needed. For Asthma symptoms.         Marland Kitchen levothyroxine (SYNTHROID, LEVOTHROID) 200 MCG tablet   Oral   Take 200 mcg by mouth daily.         . nitrofurantoin, macrocrystal-monohydrate, (MACROBID) 100 MG capsule   Oral   Take 100 mg by mouth 2 (two) times daily.         . Prenatal Vit-Fe Fumarate-FA (MULTIVITAMIN-PRENATAL) 27-0.8 MG TABS   Oral   Take 1 tablet by mouth daily.         . traMADol (ULTRAM) 50 MG tablet   Oral   Take 50 mg by mouth every 6 (six) hours as needed.         . traMADol (ULTRAM) 50 MG tablet   Oral   Take 1 tablet (50 mg total) by mouth every 6 (six) hours as  needed for pain.   30 tablet   0    BP 129/85  Pulse 73  Temp(Src) 98.3 F (36.8 C) (Oral)  Resp 16  Ht 5\' 10"  (1.778 m)  Wt 155 lb (70.308 kg)  BMI 22.24 kg/m2  SpO2 100%  LMP 03/22/2012 Physical Exam  Nursing note and vitals reviewed. Constitutional: She is oriented to person, place, and time. She appears well-developed and well-nourished.  HENT:  Head: Normocephalic and atraumatic.  Eyes: EOM are normal. Pupils are equal, round, and reactive to light.  Neck: Normal range of motion. Neck supple.  Cardiovascular: Normal rate, normal heart sounds and intact distal pulses.   Pulmonary/Chest: Effort normal and breath sounds normal.  Abdominal: She exhibits distension. There is tenderness (diffuse, worse in lower). There is rebound and guarding.  Decreased bowel sounds  Musculoskeletal: Normal range of motion. She exhibits no edema and no tenderness.  Neurological: She is alert and oriented to person, place, and time. She has normal strength. No cranial nerve deficit or sensory deficit.  Skin: Skin is warm and dry. No rash noted.  Psychiatric: She has a normal mood and affect.    ED Course   Procedures (including critical care time)  Labs Reviewed  CBC WITH DIFFERENTIAL - Abnormal; Notable for the following:    RBC 3.73 (*)    Hemoglobin 11.2 (*)    HCT 34.0 (*)    All other components within normal limits  COMPREHENSIVE METABOLIC PANEL - Abnormal; Notable for the following:    Potassium 3.4 (*)    Glucose, Bld 102 (*)    Alkaline Phosphatase 29 (*)    All other components within normal limits  LIPASE, BLOOD  URINALYSIS, ROUTINE W REFLEX MICROSCOPIC   Ct Abdomen Pelvis W Contrast  03/13/2013   *RADIOLOGY REPORT*  Clinical Data: Diffuse abdominal pain, bloating, prior hysterectomy  CT ABDOMEN AND PELVIS WITH CONTRAST  Technique:  Multidetector CT imaging of the abdomen and pelvis was performed following the standard protocol during bolus administration of intravenous  contrast.  Contrast: 50mL OMNIPAQUE IOHEXOL 300 MG/ML  SOLN, OMNIPAQUE IOHEXOL 300 MG/ML  SOLN  Comparison: Abdominal radiograph same date  Findings: There is a small amount of abdominal and pelvic ascites. Within the pelvis, there are numerous rim enhancing fluid collections, the largest of which measures 5.1 x 4.2 cm image 63. Uterus and reportedly surgically absent.  The  ovaries are not well visualized due to the presence of rim enhancing fluid collections. No evidence of bowel obstruction.  Abdominal viscera are unremarkable.  No lymphadenopathy or free air.  The bladder is normal.  No acute osseous finding. Lumbar spine disc degenerative change is identified predominately at L5 S1.  IMPRESSION: Multiple rim enhancing pelvic fluid collections concerning for abscess.  Does the patient have a history of pelvic inflammatory disease?  Other differential considerations include ovarian neoplasm, endometriosis, or less likely sequela of ovarian torsion or sequela of bowel perforation.  Correlate with pelvic exam and laboratory findings.   Original Report Authenticated By: Christiana Pellant, M.D.   1. Pelvic abscess in female     MDM  CT images reviewed and results discussed with patient. Spoke with NMW on call for Dr. Osborn Coho who is the patient's Gyn. She will accept the patient to MAU for further eval. Pt comfortable with this plan and will drive herself there. She has not had any sedating or pain medications in the ED. She is reliable and safe to transport herself with IV in place.   Charles B. Bernette Mayers, MD 03/13/13 2048

## 2013-03-13 NOTE — MAU Provider Note (Addendum)
History     CSN: [id number]  Arrival date and time: 03/13/13 1800   None     Chief Complaint  Patient presents with  . Abdominal Pain   HPI 36yo presenting after being seen at [facility] with pelvic pain and CT Scan with Multiple rim enhancing pelvic fluid collections concerning for  abscess. Does the patient have a history of pelvic inflammatory  disease? Other differential considerations include ovarian  neoplasm, endometriosis, or less likely sequela of ovarian torsion  or sequela of bowel perforation. Correlate with pelvic exam and  laboratory findings.  I spoke with radiologist and they thought it was less likely a bowel perforation and thought it was more likely an adnexal process and recommended a pelvic u/s.  U/s showed Complex cystic abnormalities of the right adnexal region/central  pelvis as above. Two separate dominant abnormal collections are  identified. Given the presence of what appears to be a large  amount of hemorrhagic fluid in the pelvis, these may represent  hemorrhagic cysts. Neoplasm or infected collections are not  excluded by ultrasound.  The patient has had decreased appetite but no real N/V and reports bloating and difficulty with BM and flatus for the last couple of days.  She, however, reports the pain starting at 2pm yest and worsening last night.  She took an enema this morning with little results.  She reported a fever yesterday but has not had documented temp here.  Pt is s/p TVH/A-P repair/TVT.Cystoscopy September of last year.  Pt reports positive flatus this evening and that has given her some relief.  She says she feels bowels are finally moving.    OB History   Grav Para Term Preterm Abortions TAB SAB Ect Mult Living   2 2 2       2       Past Medical History  Diagnosis Date  . H/O scoliosis   . History of chicken pox   . UTI (lower urinary tract infection)   . Asthma     activity induced  . H/O thyroid disease     hashimotos  .  History of lupus   . Female cystocele   . Rectocele, female   . Thyroid disease   . Systemic lupus erythematosus   . PONV (postoperative nausea and vomiting)   . Hypothyroidism   . SVD (spontaneous vaginal delivery)     x 2    Past Surgical History  Procedure Laterality Date  . Septoplasty  2008  . Knee open lateral release  1995 & 1996    right and left  . Wisdom tooth extraction  1993  . Tonsillectomy    . Appendectomy  2008  . Vaginal hysterectomy  04/26/2012    Procedure: HYSTERECTOMY VAGINAL;  Surgeon: Purcell Nails, MD;  Location: [facility];  Service: Gynecology;  Laterality: N/A;  4 hours  . Anterior and posterior repair  04/26/2012    Procedure: ANTERIOR (CYSTOCELE) AND POSTERIOR REPAIR (RECTOCELE);  Surgeon: Purcell Nails, MD;  Location: [facility];  Service: Gynecology;  Laterality: N/A;  . Bladder suspension  04/26/2012    Procedure: TRANSVAGINAL TAPE (TVT) PROCEDURE;  Surgeon: Purcell Nails, MD;  Location: [facility];  Service: Gynecology;  Laterality: N/A;  . Cystoscopy  04/26/2012    Procedure: CYSTOSCOPY;  Surgeon: Purcell Nails, MD;  Location: [facility];  Service: Gynecology;  Laterality: Bilateral;    Family History  Problem Relation Age of Onset  . Heart disease Mother  af    History  Substance Use Topics  . Smoking status: Former Smoker -- 0.10 packs/day    Types: Cigarettes  . Smokeless tobacco: Former Neurosurgeon    Quit date: 08/16/2001  . Alcohol Use: Yes     Comment: social    Allergies:  Allergies  Allergen Reactions  . Penicillins Swelling    Childhood Rxn.  Can take Keflex ok.    Prescriptions prior to admission  Medication Sig Dispense Refill  . Clobetasol Prop Emollient Base 0.05 % emollient cream Apply topically 2 (two) times daily as needed. For Lupus rash.      . ibuprofen (ADVIL,MOTRIN) 600 MG tablet Take 600 mg by mouth every 6 (six) hours as needed.      . levalbuterol (XOPENEX HFA) 45 MCG/ACT inhaler Inhale 1-2 puffs into the lungs  every 6 (six) hours as needed. For Asthma symptoms.      Marland Kitchen levothyroxine (SYNTHROID, LEVOTHROID) 200 MCG tablet Take 200 mcg by mouth daily.      . nitrofurantoin, macrocrystal-monohydrate, (MACROBID) 100 MG capsule Take 100 mg by mouth 2 (two) times daily.      . Prenatal Vit-Fe Fumarate-FA (MULTIVITAMIN-PRENATAL) 27-0.8 MG TABS Take 1 tablet by mouth daily.      . traMADol (ULTRAM) 50 MG tablet Take 50 mg by mouth every 6 (six) hours as needed.      . traMADol (ULTRAM) 50 MG tablet Take 1 tablet (50 mg total) by mouth every 6 (six) hours as needed for pain.  30 tablet  0    ROS See HPI  Physical Exam   Blood pressure 124/80, pulse 71, temperature 98.7 F (37.1 C), temperature source Oral, resp. rate 16, height 5\' 10"  (1.778 m), weight 70.308 kg (155 lb), last menstrual period 03/22/2012, SpO2 100.00%.  Physical Exam  Alert and in no obvious distress Lungs CTA bilaterally CV RRR ABD soft, rlq discomfort, no rebound or guarding, NABS, slightly distended Ext no calf tenderness Spec vaginal cuff intact Pelvic Exam No palpable masses with tenderness on rt  MAU Course  Procedures  As above in HPI  CBC with WBC 9.0 and no shift Hgb 11.2  Wet prep with +clue cells  Assessment and Plan  37 yo with pelvic pain and unclear etiology.  Will observe overnight.  Based on imaging studies, I suspect the most likely diagnosis is rt hemorrhagic cysts that may have ruptured. Recheck CBC with diff in am Call with temp >100.4 SCDs for DVT prophylaxis overnight Will repeat u/s at some point If pain not improving may consider further studies to assess bowels and starting antibiotics Pt needs to f/u with me in office about a week or so after discharge Flagyl for BV - will give IV secondary to distention and bloating  Nona Gracey Y 03/13/2013, 11:18 PM

## 2013-03-13 NOTE — MAU Note (Signed)
Pt sent here from [facility] to get apelvic [place] per Dr Su Hilt

## 2013-03-13 NOTE — ED Notes (Signed)
Transfer by POV to MAU at [facility]

## 2013-03-13 NOTE — ED Notes (Signed)
Abdominal pain onset 36 hours ago used enema this morning still having painful gas and pmd sent here to have a ct to rule out obstruction

## 2013-03-14 LAB — CBC WITH DIFFERENTIAL/PLATELET
Basophils Absolute: 0 10*3/uL (ref 0.0–0.1)
Eosinophils Absolute: 0.1 10*3/uL (ref 0.0–0.7)
Lymphocytes Relative: 38 % (ref 12–46)
Lymphs Abs: 2.3 10*3/uL (ref 0.7–4.0)
Neutrophils Relative %: 52 % (ref 43–77)
Platelets: 164 10*3/uL (ref 150–400)
RBC: 3.29 MIL/uL — ABNORMAL LOW (ref 3.87–5.11)
RDW: 13 % (ref 11.5–15.5)
WBC: 5.9 10*3/uL (ref 4.0–10.5)

## 2013-03-14 LAB — BASIC METABOLIC PANEL
CO2: 29 mEq/L (ref 19–32)
GFR calc non Af Amer: >90 mL/min (ref >90)
Glucose, Bld: 100 mg/dL — ABNORMAL HIGH (ref 70–99)
Potassium: 4 mEq/L (ref 3.5–5.1)
Sodium: 138 mEq/L (ref 135–145)

## 2013-03-14 MED ORDER — PNEUMOCOCCAL VAC POLYVALENT 25 MCG/0.5ML IJ INJ: 0.5000 mL | INJECTION | INTRAMUSCULAR | Status: DC

## 2013-03-14 MED ORDER — METRONIDAZOLE 500 MG PO TABS: 500.0000 mg | ORAL_TABLET | Freq: Two times a day (BID) | ORAL | Status: DC

## 2013-03-14 NOTE — Discharge Summary (Signed)
DOA 03/13/13 DOD 03/14/13 Subjective: Patient report, tolerating PO, + flatus, + BM and no problems voiding.  She no longer has pain  Objective: I have reviewed patient's vital signs, intake and output, medications, labs, microbiology, pathology and radiology results.  General: alert Resp: clear to auscultation bilaterally Cardio: regular rate and rhythm, S1, S2 normal, no murmur, click, rub or gallop GI: soft, non-tender; bowel sounds normal; no masses,  no organomegaly Extremities: extremities normal, atraumatic, no cyanosis or edema Vaginal Bleeding: none  Pt came into the hospital for evaluation of pelvic and abdominal pain.  She had a pelvic mass and consitpaiton.  She received Flagyl for BV.  Since having a bowel movement, she feels better and is ready to go home  Discharge to home with pain  And fever precautions.   Will follow up with Dr Su Hilt in one week DC status Stable DC Meds Flagyl

## 2013-03-14 NOTE — Progress Notes (Signed)
Ur chart review completed.  

## 2013-03-14 NOTE — Progress Notes (Signed)
Pt ambulated out  Teaching complete 

## 2013-12-25 ENCOUNTER — Other Ambulatory Visit: Payer: Self-pay | Admitting: Obstetrics and Gynecology

## 2013-12-25 DIAGNOSIS — Z1231 Encounter for screening mammogram for malignant neoplasm of breast: Secondary | ICD-10-CM

## 2014-01-03 ENCOUNTER — Ambulatory Visit
Admission: RE | Admit: 2014-01-03 | Discharge: 2014-01-03 | Disposition: A | Discharge status: Home / Self Care | Payer: Managed Care, Other (non HMO) | Source: Ambulatory Visit | Attending: Obstetrics and Gynecology | Admitting: Obstetrics and Gynecology

## 2014-01-03 ENCOUNTER — Encounter (INDEPENDENT_AMBULATORY_CARE_PROVIDER_SITE_OTHER): Payer: Self-pay

## 2014-01-03 DIAGNOSIS — Z1231 Encounter for screening mammogram for malignant neoplasm of breast: Secondary | ICD-10-CM

## 2014-06-17 ENCOUNTER — Encounter (HOSPITAL_BASED_OUTPATIENT_CLINIC_OR_DEPARTMENT_OTHER): Payer: Self-pay | Admitting: *Deleted

## 2014-09-20 IMAGING — US US TRANSVAGINAL NON-OB
1 series · 13 of 25 positions shown · non-contrast
Comparison: CT of the abdomen and pelvis earlier today.

CLINICAL DATA: Pelvic pain with abnormal fluid collections in the
central pelvis by CT.  Status post hysterectomy in 7785.



[Series 1: us pelvis complete · 56 acquisitions, 13 frames shown]
[im 1/56]
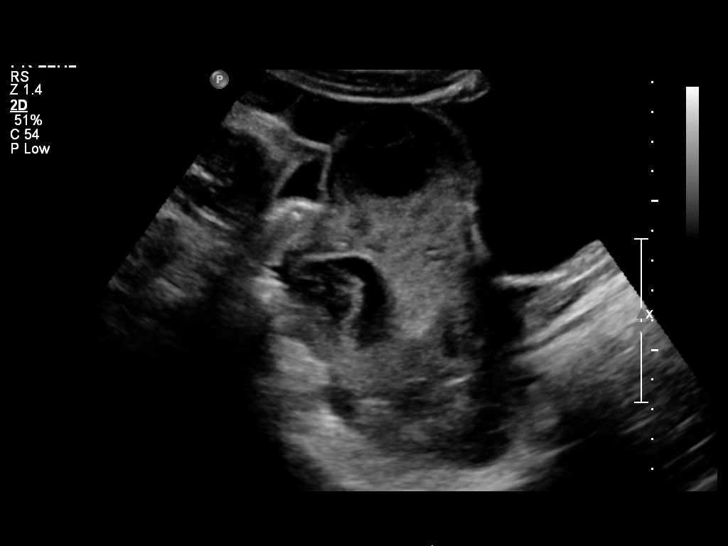
[im 5/56]
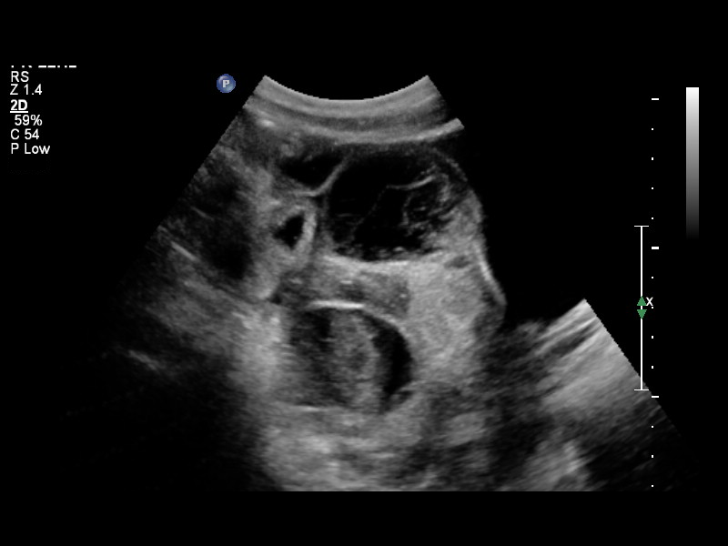
[im 10/56]
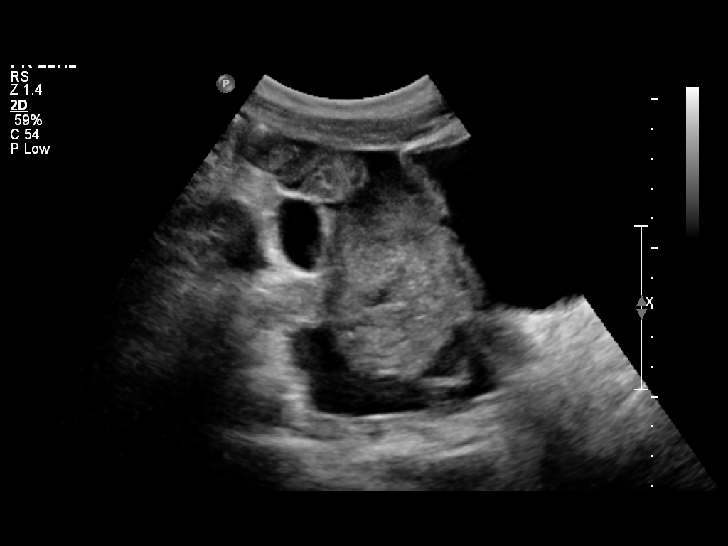
[im 14/56]
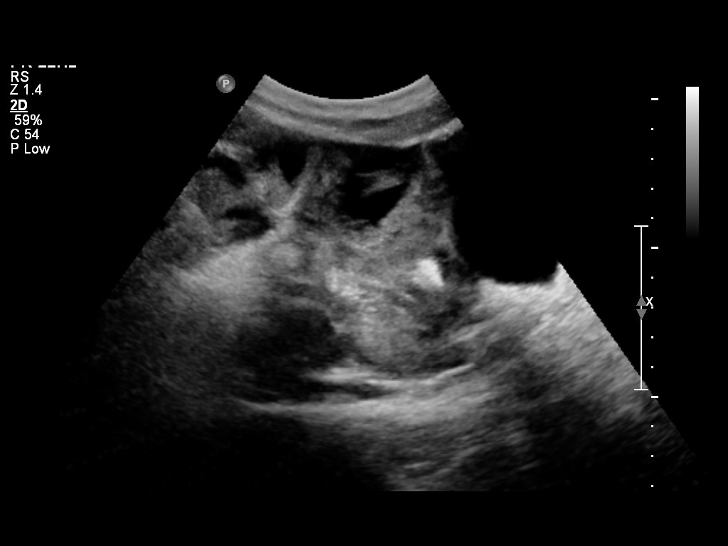
[im 19/56]
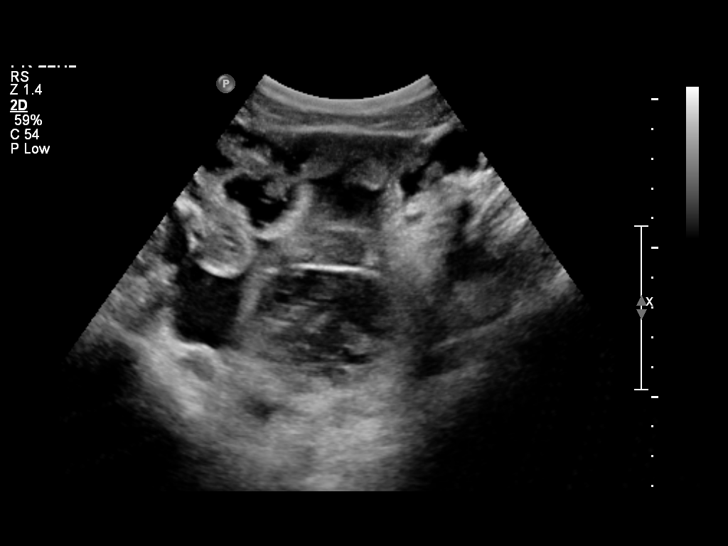
[im 23/56]
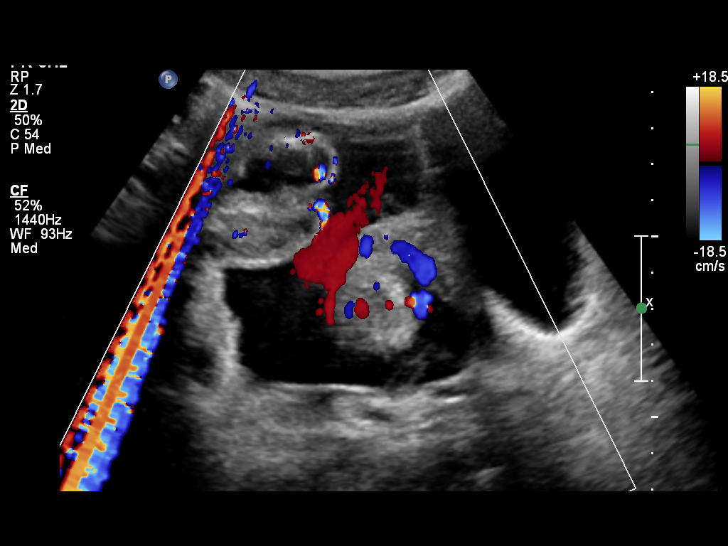
[im 28/56]
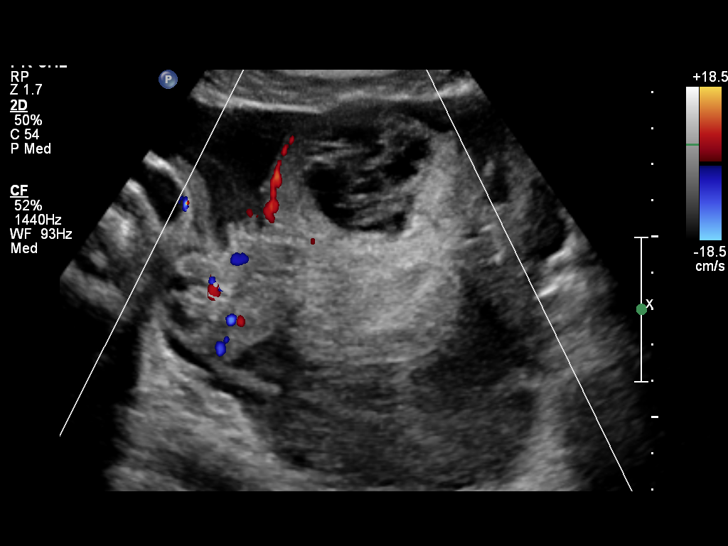
[im 33/56]
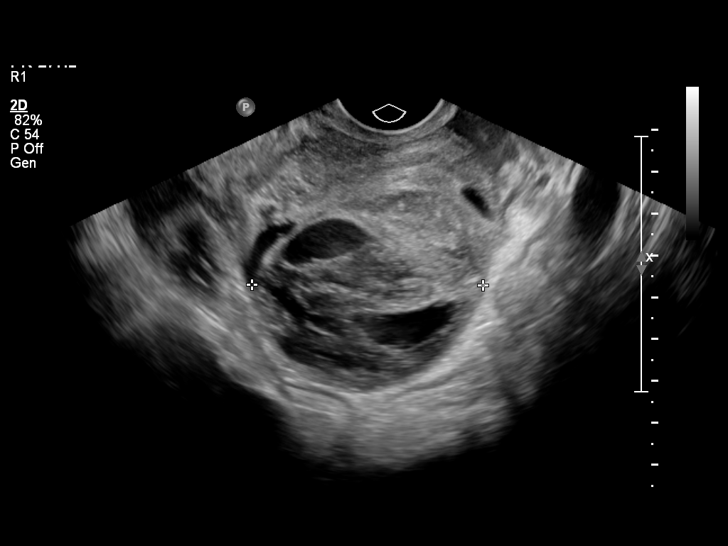
[im 37/56]
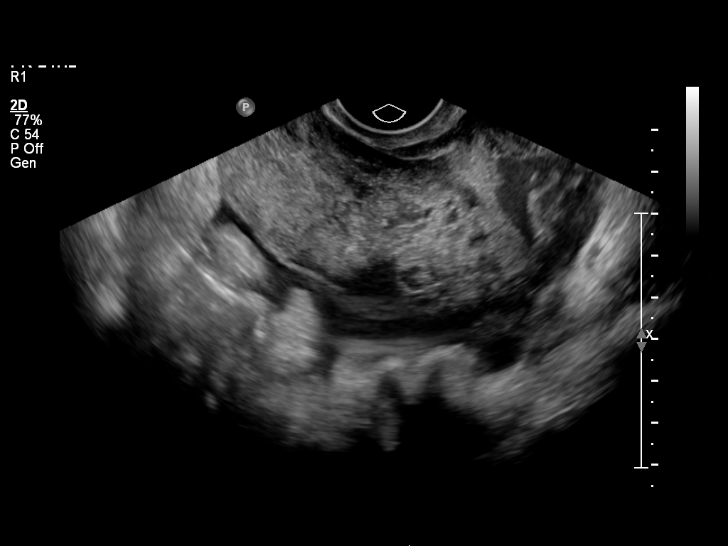
[im 42/56]
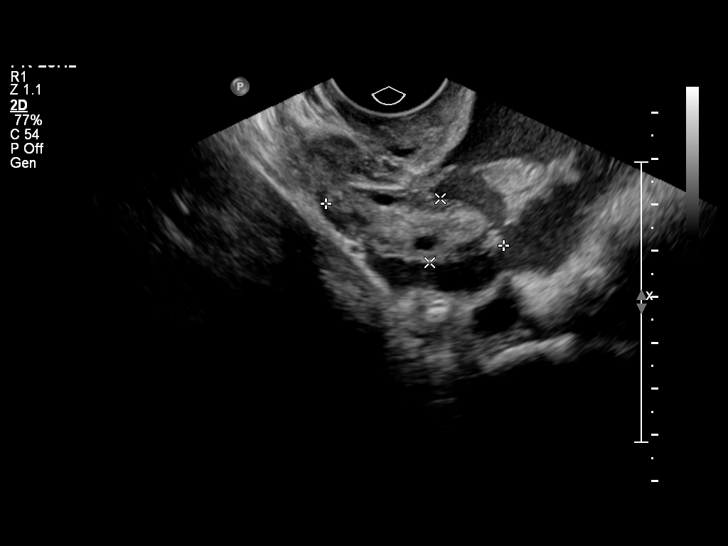
[im 46/56]
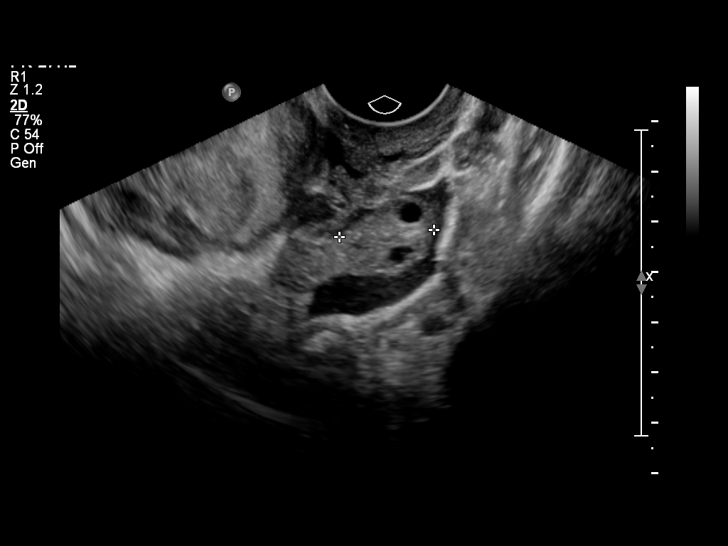
[im 51/56]
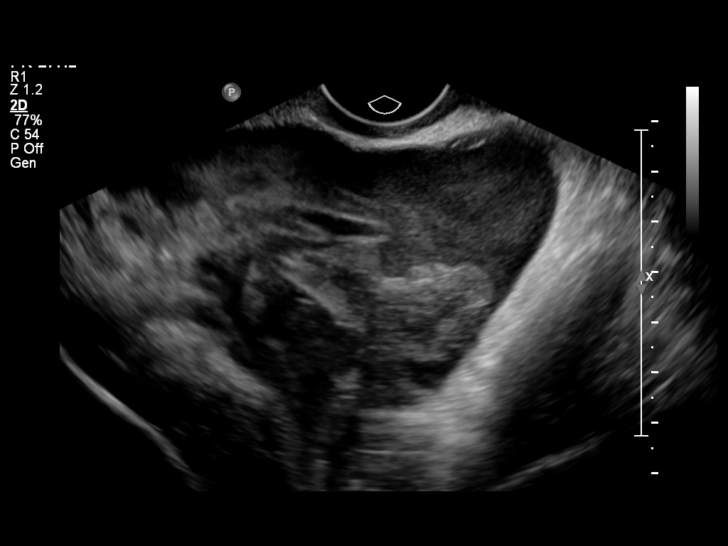
[im 56/56]
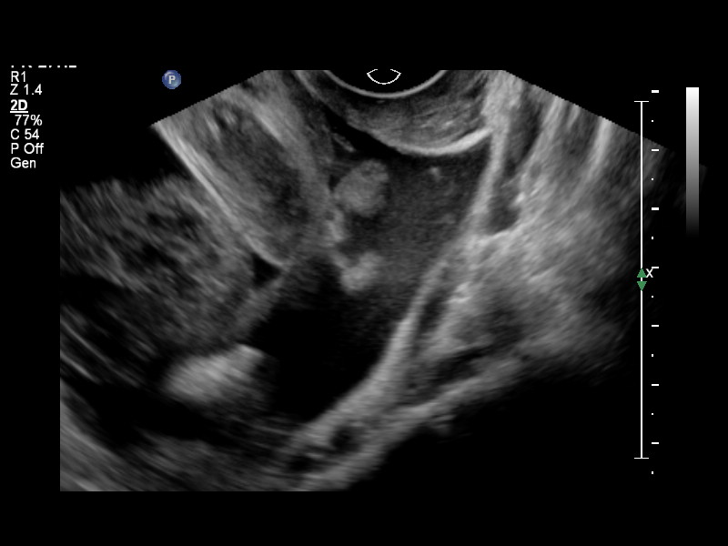

[13 of 25 positions shown; findings below may reference images not displayed]

FINDINGS: Uterus: Surgically absent.

Right ovary:  Two separate dominant adnexal complex collections are
identified as depicted on the CT exam and appear to be more
associated with the right adnexa.  One of these measures 5.1 x
x 4.4 cm and the second measures 5.8 x 4.5 x 4.8 cm.  No normal
adjacent ovarian parenchyma is identified.  These are complex
cystic abnormalities with differential considerations including
hemorrhagic cysts, infected collections or neoplasms.

Left ovary: The left ovary is visualized measuring approximately 4
x 1.4 x 1.9 cm and has a normal appearance with some surrounding
free fluid present.

Other findings: There is a fairly large amount of echogenic fluid
in the pelvis which likely represents a hemorrhagic fluid.
IMPRESSION: Complex cystic abnormalities of the right adnexal region/central
pelvis as above.  Two separate dominant abnormal collections are
identified.  Given the presence of what appears to be a large
amount of hemorrhagic fluid in the pelvis, these may represent
hemorrhagic cysts.  Neoplasm or infected collections are not
excluded by ultrasound.

## 2018-03-13 ENCOUNTER — Ambulatory Visit: Payer: Self-pay | Admitting: Surgery

## 2018-04-27 NOTE — Patient Instructions (Addendum)
Shirlyn GoltzMichelle Catherine Macomber  04/27/2018   Your procedure is scheduled on: Friday 05/05/2018  [facility]              Report to admitting at  0530  AM    Call this number if you have problems the morning of surgery [phone]    Remember: Do not eat food or drink liquids :After Midnight.              BRUSH YOUR TEETH MORNING OF SURGERY AND RINSE YOUR MOUTH OUT, NO CHEWING GUM CANDY OR MINTS.     Take these medicines the morning of surgery with A SIP OF WATER: Levothyroxine (Synthroid), use Xopenex inhaler if needed and bring it with you to the hospital.                                You may not have any metal on your body including hair pins and              piercings  Do not wear jewelry, make-up, lotions, powders or perfumes, deodorant             Do not wear nail polish.  Do not shave  48 hours prior to surgery.              Do not bring valuables to the hospital.   [facility] IS NOT RESPONSIBLE   FOR VALUABLES.   Contacts, dentures or bridgework may not be worn into surgery.  Leave suitcase in the car. After surgery it may be brought to your room.                   Please read over the following fact sheets you were given: _____________________________________________________________________             [facility] - Preparing for [facility] Before surgery, you can play an important role.  Because skin is not sterile, your skin needs to be as free of germs as possible.  You can reduce the number of germs on your skin by washing with CHG (chlorahexidine gluconate) soap before surgery.  CHG is an antiseptic cleaner which kills germs and bonds with the skin to continue killing germs even after washing. Please DO NOT use if you have an allergy to CHG or antibacterial soaps.  If your skin becomes reddened/irritated stop using the CHG and inform your nurse when you arrive at Short Stay. Do not shave (including legs and  underarms) for at least 48 hours prior to the first CHG shower.  You may shave your face/neck. Please follow these instructions carefully:  1.  Shower with CHG Soap the night before surgery and the  morning of Surgery.  2.  If you choose to wash your hair, wash your hair first as usual with your  normal  shampoo.  3.  After you shampoo, rinse your hair and body thoroughly to remove the  shampoo.                           4.  Use CHG as you would any other liquid soap.  You can apply chg directly  to the skin and wash  Gently with a scrungie or clean washcloth.  5.  Apply the CHG Soap to your body ONLY FROM THE NECK DOWN.   Do not use on face/ open                           Wound or open sores. Avoid contact with eyes, ears mouth and genitals (private parts).                       Wash face,  Genitals (private parts) with your normal soap.             6.  Wash thoroughly, paying special attention to the area where your surgery  will be performed.  7.  Thoroughly rinse your body with warm water from the neck down.  8.  DO NOT shower/wash with your normal soap after using and rinsing off  the CHG Soap.                9.  Pat yourself dry with a clean towel.            10.  Wear clean pajamas.            11.  Place clean sheets on your bed the night of your first shower and do not  sleep with pets. Day of Surgery : Do not apply any lotions/deodorants the morning of surgery.  Please wear clean clothes to the hospital/surgery center.  FAILURE TO FOLLOW THESE INSTRUCTIONS MAY RESULT IN THE CANCELLATION OF YOUR SURGERY PATIENT SIGNATURE_________________________________  NURSE SIGNATURE__________________________________  ________________________________________________________________________

## 2018-04-30 ENCOUNTER — Encounter (HOSPITAL_COMMUNITY): Payer: Self-pay | Admitting: Surgery

## 2018-04-30 DIAGNOSIS — E063 Autoimmune thyroiditis: Secondary | ICD-10-CM | POA: Diagnosis present

## 2018-04-30 NOTE — H&P (Signed)
General Surgery [facility]  Shannon Howell DOB: 12-27-1975 Divorced / Language: Undefined / Race: Refused to Report/Unreported Female   History of Present Illness   The patient is a 42 year old female who presents for evaluation of a thyroid disorder.  CHIEF COMPLAINT: Hashimoto's thyroiditis  Patient returns to my practice for review of her recent clinical course and again consideration for thyroidectomy for management of Hashimoto's thyroiditis. Patient is followed by Dr. Dagmar Hait from endocrinology. Patient continues to have inflammatory episodes. Her TSH levels have varied widely and makes it difficult to manage her medication. She continues to have intermittent episodes of hoarseness interspersed with periods of time in which her voice is largely normal. She continues to have a globus sensation. Patient desires to proceed with total thyroidectomy. This has been recommended by her endocrinologist as well. Her most recent TSH level was suppressed at 0.07 on her current dosage of levothyroxine 175 g daily. Patient did not receive evaluation at [facility]. She does not wish to be seen there in consultation prior to surgery.   Problem List/Past Medical HOARSENESS, CHRONIC (R49.0)  HASHIMOTO'S DISEASE (E06.3)   Past Surgical History Appendectomy  Hysterectomy (not due to cancer) - Partial  Knee Surgery  Bilateral. Tonsillectomy   Diagnostic Studies History Colonoscopy  never Mammogram  within last year Pap Smear  1-5 years ago  Allergies  Penicillins  Swelling.  Medication History Levothyroxine Sodium (175MCG Tablet, Oral) Active. ClonazePAM (0.'5MG'$  Tablet, Oral) Active. Xopenex HFA (45MCG/ACT Aerosol, Inhalation) Active. Claritin ('10MG'$  Tablet, Oral) Active. ClobetaPlus Cream (0.05 & 2.3% Kit, External) Active. Medications Reconciled  Social History  Alcohol use  Occasional alcohol use. Caffeine  use  Coffee. No drug use  Tobacco use  Former smoker.  Family History Cancer  Father. Melanoma  Father. Thyroid problems  Mother.  Pregnancy / Birth History  Age at menarche  89 years. Contraceptive History  Depo-provera, Oral contraceptives. Gravida  2 Irregular periods  Length (months) of breastfeeding  >24 Maternal age  12-30 Para  2  Other Problems Asthma  Thyroid Disease   Vitals Weight: 173.4 lb Height: 70in Body Surface Area: 1.96 m Body Mass Index: 24.88 kg/m  Temp.: 97.66F(Temporal)  Pulse: 77 (Regular)  BP: 116/70 (Sitting, Right Arm, Standard)  Physical Exam  See vital signs recorded above  GENERAL APPEARANCE Development: normal Nutritional status: normal Gross deformities: none  SKIN Rash, lesions, ulcers: none Induration, erythema: none Nodules: none palpable  EYES Conjunctiva and lids: normal Pupils: equal and reactive Iris: normal bilaterally  EARS, NOSE, MOUTH, THROAT External ears: no lesion or deformity External nose: no lesion or deformity Hearing: grossly normal Lips: no lesion or deformity Dentition: normal for age Oral mucosa: moist  NECK Symmetric: yes Trachea: midline Thyroid: no palpable nodules in the thyroid bed  CHEST Respiratory effort: normal Retraction or accessory muscle use: no Breath sounds: normal bilaterally Rales, rhonchi, wheeze: none  CARDIOVASCULAR Auscultation: regular rhythm, normal rate Murmurs: none Pulses: carotid and radial pulse 2+ palpable Lower extremity edema: none Lower extremity varicosities: none  MUSCULOSKELETAL Station and gait: normal Digits and nails: no clubbing or cyanosis Muscle strength: grossly normal all extremities Range of motion: grossly normal all extremities Deformity: none  LYMPHATIC Cervical: none palpable Supraclavicular: none palpable  PSYCHIATRIC Oriented to person, place, and time: yes Mood and affect: normal for  situation Judgment and insight: appropriate for situation    Assessment & Plan  HASHIMOTO'S DISEASE (E06.3)  Patient returns to  my practice to again discuss total thyroidectomy for management of Hashimoto's thyroiditis. Patient continues to have intermittent problems causing her to be difficult to regulate her thyroid hormone supplementation. She notes swelling in the neck and pain when she has episodes of inflammation. She notes changes in her voice quality. She does have mild compressive symptoms. She would like to proceed with total thyroidectomy.  We again discussed the procedure. We discussed the location of the surgical incision. We discussed risk and benefits of the procedure including risks to parathyroid glands and risk to recurrent laryngeal nerves. We discussed the potential for permanent voice changes. We discussed the hospital stay to be anticipated and her postoperative recovery. Patient understands all of these issues and wishes to proceed with surgery in the near future.  Patient has business travel to [place] in the coming few weeks. She would like to make arrangements for surgery following that business trip.  The risks and benefits of the procedure have been discussed at length with the patient. The patient understands the proposed procedure, potential alternative treatments, and the course of recovery to be expected. All of the patient's questions have been answered at this time. The patient wishes to proceed with surgery.   Shannon Howell, [facility] Office: [phone]

## 2018-05-01 ENCOUNTER — Encounter (HOSPITAL_COMMUNITY)
Admission: RE | Admit: 2018-05-01 | Discharge: 2018-05-01 | Disposition: A | Discharge status: Home / Self Care | Payer: Managed Care, Other (non HMO) | Source: Ambulatory Visit | Attending: Surgery | Admitting: Surgery

## 2018-05-01 ENCOUNTER — Ambulatory Visit (HOSPITAL_COMMUNITY)
Admission: RE | Admit: 2018-05-01 | Discharge: 2018-05-01 | Disposition: A | Discharge status: Home / Self Care | Payer: Managed Care, Other (non HMO) | Source: Ambulatory Visit | Attending: Surgery | Admitting: Surgery

## 2018-05-01 ENCOUNTER — Encounter (HOSPITAL_COMMUNITY): Payer: Self-pay

## 2018-05-01 ENCOUNTER — Other Ambulatory Visit: Payer: Self-pay

## 2018-05-01 DIAGNOSIS — Z01811 Encounter for preprocedural respiratory examination: Secondary | ICD-10-CM

## 2018-05-01 LAB — CBC
HEMATOCRIT: 41.8 % (ref 36.0–46.0)
HEMOGLOBIN: 13.7 g/dL (ref 12.0–15.0)
MCH: 30.3 pg (ref 26.0–34.0)
MCHC: 32.8 g/dL (ref 30.0–36.0)
MCV: 92.5 fL (ref 78.0–100.0)
Platelets: 203 10*3/uL (ref 150–400)
RBC: 4.52 MIL/uL (ref 3.87–5.11)
RDW: 13.3 % (ref 11.5–15.5)
WBC: 4.8 10*3/uL (ref 4.0–10.5)

## 2018-05-04 NOTE — Anesthesia Preprocedure Evaluation (Addendum)
Anesthesia Evaluation  Patient identified by MRN, date of birth, ID band Patient awake    Reviewed: Allergy & Precautions, NPO status , Patient's Chart, lab work & pertinent test results  History of Anesthesia Complications (+) PONV and history of anesthetic complications  Airway Mallampati: II  TM Distance: >3 FB Neck ROM: Full    Dental no notable dental hx. (+) Dental Advisory Given   Pulmonary asthma , former smoker,    Pulmonary exam normal        Cardiovascular negative cardio ROS Normal cardiovascular exam     Neuro/Psych negative neurological ROS     GI/Hepatic negative GI ROS, Neg liver ROS,   Endo/Other  Hypothyroidism Lupus  Renal/GU negative Renal ROS     Musculoskeletal negative musculoskeletal ROS (+)   Abdominal   Peds  Hematology negative hematology ROS (+)   Anesthesia Other Findings Day of surgery medications reviewed with the patient.  Reproductive/Obstetrics                            Anesthesia Physical Anesthesia Plan  ASA: II  Anesthesia Plan: General   Post-op Pain Management:    Induction: Intravenous  PONV Risk Score and Plan: 4 or greater and Ondansetron, Dexamethasone, Scopolamine patch - Pre-op and Diphenhydramine  Airway Management Planned: Oral ETT  Additional Equipment:   Intra-op Plan:   Post-operative Plan: Extubation in OR  Informed Consent: I have reviewed the patients History and Physical, chart, labs and discussed the procedure including the risks, benefits and alternatives for the proposed anesthesia with the patient or authorized representative who has indicated his/her understanding and acceptance.   Dental advisory given  Plan Discussed with: CRNA and Anesthesiologist  Anesthesia Plan Comments:        Anesthesia Quick Evaluation

## 2018-05-05 ENCOUNTER — Ambulatory Visit (HOSPITAL_COMMUNITY): Payer: Managed Care, Other (non HMO) | Admitting: Anesthesiology

## 2018-05-05 ENCOUNTER — Encounter (HOSPITAL_COMMUNITY)
Admission: RE | Disposition: A | Discharge status: Home / Self Care | Payer: Self-pay | Source: Ambulatory Visit | Attending: Surgery

## 2018-05-05 ENCOUNTER — Encounter (HOSPITAL_COMMUNITY): Payer: Self-pay

## 2018-05-05 ENCOUNTER — Observation Stay (HOSPITAL_COMMUNITY)
Admission: RE | Admit: 2018-05-05 | Discharge: 2018-05-06 | Disposition: A | Discharge status: Home / Self Care | Payer: Managed Care, Other (non HMO) | Source: Ambulatory Visit | Attending: Surgery | Admitting: Surgery

## 2018-05-05 ENCOUNTER — Other Ambulatory Visit: Payer: Self-pay

## 2018-05-05 DIAGNOSIS — Z79890 Hormone replacement therapy (postmenopausal): Secondary | ICD-10-CM | POA: Insufficient documentation

## 2018-05-05 DIAGNOSIS — Z87891 Personal history of nicotine dependence: Secondary | ICD-10-CM | POA: Diagnosis not present

## 2018-05-05 DIAGNOSIS — E079 Disorder of thyroid, unspecified: Secondary | ICD-10-CM | POA: Diagnosis present

## 2018-05-05 DIAGNOSIS — J45909 Unspecified asthma, uncomplicated: Secondary | ICD-10-CM | POA: Diagnosis not present

## 2018-05-05 DIAGNOSIS — Z79899 Other long term (current) drug therapy: Secondary | ICD-10-CM | POA: Diagnosis not present

## 2018-05-05 DIAGNOSIS — E063 Autoimmune thyroiditis: Secondary | ICD-10-CM | POA: Diagnosis not present

## 2018-05-05 HISTORY — PX: THYROIDECTOMY: SHX17

## 2018-05-05 SURGERY — THYROIDECTOMY
Anesthesia: General

## 2018-05-05 MED ORDER — CHLORHEXIDINE GLUCONATE CLOTH 2 % EX PADS: 6.0000 | MEDICATED_PAD | Freq: Once | CUTANEOUS | Status: DC

## 2018-05-05 MED ORDER — DEXAMETHASONE SODIUM PHOSPHATE 10 MG/ML IJ SOLN
INTRAMUSCULAR | Status: AC
  Filled 2018-05-05: qty 2

## 2018-05-05 MED ORDER — PROMETHAZINE HCL 25 MG/ML IJ SOLN
INTRAMUSCULAR | Status: AC
  Administered 2018-05-05: 12.5 mg via INTRAVENOUS
  Filled 2018-05-05: qty 1

## 2018-05-05 MED ORDER — LEVALBUTEROL HCL 1.25 MG/0.5ML IN NEBU: 1.2500 mg | INHALATION_SOLUTION | Freq: Four times a day (QID) | RESPIRATORY_TRACT | Status: DC | PRN

## 2018-05-05 MED ORDER — SUGAMMADEX SODIUM 200 MG/2ML IV SOLN
INTRAVENOUS | Status: DC | PRN
  Administered 2018-05-05: 200 mg via INTRAVENOUS

## 2018-05-05 MED ORDER — ACETAMINOPHEN 325 MG PO TABS
650.0000 mg | ORAL_TABLET | Freq: Four times a day (QID) | ORAL | Status: DC | PRN
  Administered 2018-05-05: 650 mg via ORAL
  Filled 2018-05-05: qty 2

## 2018-05-05 MED ORDER — ONDANSETRON 4 MG PO TBDP: 4.0000 mg | ORAL_TABLET | Freq: Four times a day (QID) | ORAL | Status: DC | PRN

## 2018-05-05 MED ORDER — LIDOCAINE 2% (20 MG/ML) 5 ML SYRINGE
INTRAMUSCULAR | Status: DC | PRN
  Administered 2018-05-05: 80 mg via INTRAVENOUS

## 2018-05-05 MED ORDER — ROCURONIUM BROMIDE 10 MG/ML (PF) SYRINGE
PREFILLED_SYRINGE | INTRAVENOUS | Status: DC | PRN
  Administered 2018-05-05: 10 mg via INTRAVENOUS
  Administered 2018-05-05 (×2): 20 mg via INTRAVENOUS
  Administered 2018-05-05: 50 mg via INTRAVENOUS

## 2018-05-05 MED ORDER — MIDAZOLAM HCL 2 MG/2ML IJ SOLN
INTRAMUSCULAR | Status: AC
  Filled 2018-05-05: qty 2

## 2018-05-05 MED ORDER — HYDROCODONE-ACETAMINOPHEN 5-325 MG PO TABS
1.0000 | ORAL_TABLET | ORAL | Status: DC | PRN
  Filled 2018-05-05: qty 1

## 2018-05-05 MED ORDER — DIPHENHYDRAMINE HCL 50 MG/ML IJ SOLN
INTRAMUSCULAR | Status: DC | PRN
  Administered 2018-05-05: 12.5 mg via INTRAVENOUS

## 2018-05-05 MED ORDER — FENTANYL CITRATE (PF) 100 MCG/2ML IJ SOLN
INTRAMUSCULAR | Status: AC
  Filled 2018-05-05: qty 2

## 2018-05-05 MED ORDER — SCOPOLAMINE 1 MG/3DAYS TD PT72
1.0000 | MEDICATED_PATCH | TRANSDERMAL | Status: DC
  Administered 2018-05-05: 1.5 mg via TRANSDERMAL
  Filled 2018-05-05: qty 1

## 2018-05-05 MED ORDER — HYDROMORPHONE HCL 1 MG/ML IJ SOLN: 1.0000 mg | INTRAMUSCULAR | Status: DC | PRN

## 2018-05-05 MED ORDER — LIDOCAINE 2% (20 MG/ML) 5 ML SYRINGE
INTRAMUSCULAR | Status: AC
  Filled 2018-05-05: qty 10

## 2018-05-05 MED ORDER — CEFAZOLIN SODIUM-DEXTROSE 2-4 GM/100ML-% IV SOLN
2.0000 g | INTRAVENOUS | Status: AC
  Administered 2018-05-05: 2 g via INTRAVENOUS
  Filled 2018-05-05: qty 100

## 2018-05-05 MED ORDER — DEXAMETHASONE SODIUM PHOSPHATE 10 MG/ML IJ SOLN
INTRAMUSCULAR | Status: DC | PRN
  Administered 2018-05-05: 10 mg via INTRAVENOUS

## 2018-05-05 MED ORDER — ONDANSETRON HCL 4 MG/2ML IJ SOLN: 4.0000 mg | Freq: Four times a day (QID) | INTRAMUSCULAR | Status: DC | PRN

## 2018-05-05 MED ORDER — TRAMADOL HCL 50 MG PO TABS: 50.0000 mg | ORAL_TABLET | Freq: Four times a day (QID) | ORAL | 0 refills | Status: AC | PRN

## 2018-05-05 MED ORDER — ACETAMINOPHEN 650 MG RE SUPP: 650.0000 mg | Freq: Four times a day (QID) | RECTAL | Status: DC | PRN

## 2018-05-05 MED ORDER — ONDANSETRON HCL 4 MG/2ML IJ SOLN
INTRAMUSCULAR | Status: DC | PRN
  Administered 2018-05-05 (×2): 4 mg via INTRAVENOUS

## 2018-05-05 MED ORDER — ROCURONIUM BROMIDE 10 MG/ML (PF) SYRINGE
PREFILLED_SYRINGE | INTRAVENOUS | Status: AC
  Filled 2018-05-05: qty 20

## 2018-05-05 MED ORDER — DIPHENHYDRAMINE HCL 50 MG/ML IJ SOLN
INTRAMUSCULAR | Status: AC
  Filled 2018-05-05: qty 1

## 2018-05-05 MED ORDER — FENTANYL CITRATE (PF) 100 MCG/2ML IJ SOLN
INTRAMUSCULAR | Status: AC
  Administered 2018-05-05: 50 ug via INTRAVENOUS
  Filled 2018-05-05: qty 2

## 2018-05-05 MED ORDER — CLONAZEPAM 0.5 MG PO TABS
0.2500 mg | ORAL_TABLET | Freq: Every day | ORAL | Status: DC
  Filled 2018-05-05: qty 1

## 2018-05-05 MED ORDER — FENTANYL CITRATE (PF) 100 MCG/2ML IJ SOLN
25.0000 ug | INTRAMUSCULAR | Status: DC | PRN
  Administered 2018-05-05: 50 ug via INTRAVENOUS

## 2018-05-05 MED ORDER — CLONAZEPAM 0.5 MG PO TABS: 0.2500 mg | ORAL_TABLET | ORAL | Status: DC

## 2018-05-05 MED ORDER — LEVOTHYROXINE SODIUM 75 MCG PO TABS
175.0000 ug | ORAL_TABLET | Freq: Every day | ORAL | Status: DC
  Administered 2018-05-06: 175 ug via ORAL
  Filled 2018-05-05: qty 1

## 2018-05-05 MED ORDER — PROMETHAZINE HCL 25 MG/ML IJ SOLN
6.2500 mg | INTRAMUSCULAR | Status: AC | PRN
  Administered 2018-05-05 (×2): 12.5 mg via INTRAVENOUS

## 2018-05-05 MED ORDER — PROPOFOL 10 MG/ML IV BOLUS
INTRAVENOUS | Status: AC
  Filled 2018-05-05: qty 40

## 2018-05-05 MED ORDER — MIDAZOLAM HCL 5 MG/5ML IJ SOLN
INTRAMUSCULAR | Status: DC | PRN
  Administered 2018-05-05: 2 mg via INTRAVENOUS

## 2018-05-05 MED ORDER — FENTANYL CITRATE (PF) 100 MCG/2ML IJ SOLN
INTRAMUSCULAR | Status: DC | PRN
  Administered 2018-05-05 (×4): 50 ug via INTRAVENOUS

## 2018-05-05 MED ORDER — 0.9 % SODIUM CHLORIDE (POUR BTL) OPTIME
TOPICAL | Status: DC | PRN
  Administered 2018-05-05: 1000 mL

## 2018-05-05 MED ORDER — ONDANSETRON HCL 4 MG/2ML IJ SOLN
INTRAMUSCULAR | Status: AC
  Filled 2018-05-05: qty 4

## 2018-05-05 MED ORDER — CLONAZEPAM 0.5 MG PO TABS
0.5000 mg | ORAL_TABLET | Freq: Every day | ORAL | Status: DC
  Administered 2018-05-05: 0.5 mg via ORAL
  Filled 2018-05-05: qty 1

## 2018-05-05 MED ORDER — CALCIUM CARBONATE ANTACID 500 MG PO CHEW: 2.0000 | CHEWABLE_TABLET | Freq: Three times a day (TID) | ORAL | 1 refills | Status: AC

## 2018-05-05 MED ORDER — LACTATED RINGERS IV SOLN
INTRAVENOUS | Status: DC
  Administered 2018-05-05 (×2): via INTRAVENOUS

## 2018-05-05 MED ORDER — TRAMADOL HCL 50 MG PO TABS: 50.0000 mg | ORAL_TABLET | Freq: Four times a day (QID) | ORAL | Status: DC | PRN

## 2018-05-05 MED ORDER — CALCIUM CARBONATE 1250 (500 CA) MG PO TABS
2.0000 | ORAL_TABLET | Freq: Three times a day (TID) | ORAL | Status: DC
  Administered 2018-05-05 – 2018-05-06 (×3): 1000 mg via ORAL
  Filled 2018-05-05 (×4): qty 1

## 2018-05-05 MED ORDER — SUGAMMADEX SODIUM 200 MG/2ML IV SOLN
INTRAVENOUS | Status: AC
  Filled 2018-05-05: qty 8

## 2018-05-05 MED ORDER — PROPOFOL 10 MG/ML IV BOLUS
INTRAVENOUS | Status: DC | PRN
  Administered 2018-05-05: 200 mg via INTRAVENOUS

## 2018-05-05 MED ORDER — KCL IN DEXTROSE-NACL 20-5-0.45 MEQ/L-%-% IV SOLN
INTRAVENOUS | Status: DC
  Administered 2018-05-05: 14:00:00 via INTRAVENOUS
  Filled 2018-05-05: qty 1000

## 2018-05-05 SURGICAL SUPPLY — 26 items
ATTRACTOMAT 16X20 MAGNETIC DRP (DRAPES) ×3 IMPLANT
BLADE SURG 15 STRL LF DISP TIS (BLADE) ×1 IMPLANT
BLADE SURG 15 STRL SS (BLADE) ×2
CHLORAPREP W/TINT 26ML (MISCELLANEOUS) ×3 IMPLANT
CLIP VESOCCLUDE MED 6/CT (CLIP) ×6 IMPLANT
CLIP VESOCCLUDE SM WIDE 6/CT (CLIP) ×15 IMPLANT
CLOSURE WOUND 1/2 X4 (GAUZE/BANDAGES/DRESSINGS) ×1
COVER SURGICAL LIGHT HANDLE (MISCELLANEOUS) ×3 IMPLANT
DRAPE LAPAROTOMY T 98X78 PEDS (DRAPES) ×3 IMPLANT
ELECT PENCIL ROCKER SW 15FT (MISCELLANEOUS) ×3 IMPLANT
ELECT REM PT RETURN 15FT ADLT (MISCELLANEOUS) ×3 IMPLANT
GAUZE 4X4 16PLY RFD (DISPOSABLE) ×3 IMPLANT
GAUZE SPONGE 4X4 12PLY STRL (GAUZE/BANDAGES/DRESSINGS) ×3 IMPLANT
GLOVE SURG ORTHO 8.0 STRL STRW (GLOVE) ×3 IMPLANT
GOWN STRL REUS W/TWL XL LVL3 (GOWN DISPOSABLE) ×6 IMPLANT
ILLUMINATOR WAVEGUIDE N/F (MISCELLANEOUS) ×3 IMPLANT
KIT BASIN OR (CUSTOM PROCEDURE TRAY) ×3 IMPLANT
PACK BASIC VI WITH GOWN DISP (CUSTOM PROCEDURE TRAY) ×3 IMPLANT
SHEARS HARMONIC 9CM CVD (BLADE) ×3 IMPLANT
STRIP CLOSURE SKIN 1/2X4 (GAUZE/BANDAGES/DRESSINGS) ×2 IMPLANT
SUT MNCRL AB 4-0 PS2 18 (SUTURE) ×3 IMPLANT
SUT VIC AB 3-0 SH 18 (SUTURE) ×6 IMPLANT
SYR BULB IRRIGATION 50ML (SYRINGE) ×3 IMPLANT
TOWEL OR 17X26 10 PK STRL BLUE (TOWEL DISPOSABLE) ×3 IMPLANT
TOWEL OR NON WOVEN STRL DISP B (DISPOSABLE) ×3 IMPLANT
YANKAUER SUCT BULB TIP 10FT TU (MISCELLANEOUS) ×3 IMPLANT

## 2018-05-05 NOTE — Anesthesia Postprocedure Evaluation (Signed)
Anesthesia Post Note  Patient: Shannon CoilMichelle C Mazariego  Procedure(s) Performed: TOTAL THYROIDECTOMY (N/A )     Patient location during evaluation: PACU Anesthesia Type: General Level of consciousness: sedated Pain management: pain level controlled Vital Signs Assessment: post-procedure vital signs reviewed and stable Respiratory status: spontaneous breathing and respiratory function stable Cardiovascular status: stable Postop Assessment: no apparent nausea or vomiting Anesthetic complications: yes Anesthetic complication details: PONV   Last Vitals:  Vitals:   05/05/18 1015 05/05/18 1030  BP: (!) 147/77 (!) 135/92  Pulse: 69 72  Resp: 12 13  Temp:    SpO2: 100% 99%    Last Pain:  Vitals:   05/05/18 1030  TempSrc:   PainSc: 0-No pain                 [name]

## 2018-05-05 NOTE — Discharge Instructions (Signed)
[facility]  THYROID & PARATHYROID SURGERY:  POST-OP INSTRUCTIONS  Always review your discharge instruction sheet from the facility where your surgery was performed.  A prescription for pain medication may be given to you upon discharge.  Take your pain medication as prescribed.  If narcotic pain medicine is not needed, then you may take acetaminophen (Tylenol) or ibuprofen (Advil) as needed.  Take your usually prescribed medications unless otherwise directed.  If you need a refill on your pain medication, please contact our office during regular business hours.  Prescriptions cannot be processed by our office after 5 pm or on weekends.  Start with a light diet upon arrival home, such as soup and crackers or toast.  Be sure to drink plenty of fluids daily.  Resume your normal diet the day after surgery.  Most patients will experience some swelling and bruising on the chest and neck area.  Ice packs will help.  Swelling and bruising can take several days to resolve.   It is common to experience some constipation after surgery.  Increasing fluid intake and taking a stool softener (Colace) will usually help or prevent this problem.  A mild laxative (Milk of Magnesia or Miralax) should be taken according to package directions if there has been no bowel movement after 48 hours.  You have steri-strips and a gauze dressing over your incision.  You may remove the gauze bandage on the second day after surgery, and you may shower at that time.  Leave your steri-strips (small skin tapes) in place directly over the incision.  These strips should remain on the skin for 5-7 days and then be removed.  You may get them wet in the shower and pat them dry.  You may resume regular (light) daily activities beginning the next day (such as daily self-care, walking, climbing stairs) gradually increasing activities as tolerated.  You may have sexual intercourse when it is comfortable.  Refrain from  any heavy lifting or straining until approved by your doctor.  You may drive when you no longer are taking prescription pain medication, you can comfortably wear a seatbelt, and you can safely maneuver your car and apply brakes.  You should see your doctor in the office for a follow-up appointment approximately three weeks after your surgery.  Make sure that you call for this appointment within a day or two after you arrive home to insure a convenient appointment time.  WHEN TO CALL YOUR DOCTOR: -- Fever greater than 101.5 -- Inability to urinate -- Nausea and/or vomiting - persistent -- Extreme swelling or bruising -- Continued bleeding from incision -- Increased pain, redness, or drainage from the incision -- Difficulty swallowing or breathing -- Muscle cramping or spasms -- Numbness or tingling in hands or around lips  The clinic staff is available to answer your questions during regular business hours.  Please don't hesitate to call and ask to speak to one of the nurses if you have concerns.  Macyn Remmert, MD [facility] Office: [phone] 

## 2018-05-05 NOTE — Interval H&P Note (Signed)
History and Physical Interval Note:  05/05/2018 7:06 AM  Shannon Howell  has presented today for surgery, with the diagnosis of hashimoto's thyroiditis.  The various methods of treatment have been discussed with the patient and family. After consideration of risks, benefits and other options for treatment, the patient has consented to    Procedure(s): TOTAL THYROIDECTOMY (N/A) as a surgical intervention .    The patient's history has been reviewed, patient examined, no change in status, stable for surgery.  I have reviewed the patient's chart and labs.  Questions were answered to the patient's satisfaction.    Darnell Levelodd Yafet Cline, MD [facility] Office: [phone]    [name]

## 2018-05-05 NOTE — Transfer of Care (Signed)
Immediate Anesthesia Transfer of Care Note  Patient: Shannon Howell  Procedure(s) Performed: Procedure(s): TOTAL THYROIDECTOMY (N/A)  Patient Location: PACU  Anesthesia Type:General  Level of Consciousness:  sedated, patient cooperative and responds to stimulation  Airway & Oxygen Therapy:Patient Spontanous Breathing and Patient connected to face mask oxgen  Post-op Assessment:  Report given to PACU RN and Post -op Vital signs reviewed and stable  Post vital signs:  Reviewed and stable  Last Vitals:  Vitals:   05/05/18 0541 05/05/18 0920  BP: (!) 134/95 138/84  Pulse: 67 81  Resp: 16 15  Temp: 37.1 C   SpO2: 100% 100%    Complications: No apparent anesthesia complications

## 2018-05-05 NOTE — Anesthesia Procedure Notes (Signed)
Procedure Name: Intubation Date/Time: 05/05/2018 7:36 AM Performed by: Lavina Hamman, CRNA Pre-anesthesia Checklist: Patient identified, Emergency Drugs available, Suction available, Patient being monitored and Timeout performed Patient Re-evaluated:Patient Re-evaluated prior to induction Oxygen Delivery Method: Circle system utilized Preoxygenation: Pre-oxygenation with 100% oxygen Induction Type: IV induction Ventilation: Mask ventilation without difficulty Laryngoscope Size: Mac and 4 Grade View: Grade I Tube type: Oral Tube size: 7.0 mm Number of attempts: 1 Airway Equipment and Method: Stylet Placement Confirmation: ETT inserted through vocal cords under direct vision,  positive ETCO2,  CO2 detector and breath sounds checked- equal and bilateral Secured at: 22 cm Tube secured with: Tape Dental Injury: Teeth and Oropharynx as per pre-operative assessment

## 2018-05-05 NOTE — Op Note (Signed)
Procedure Note  Pre-operative Diagnosis:  Hashimoto's thyroiditis  Post-operative Diagnosis:  same  Surgeon:  Darnell Levelodd Kolbie Clarkston, MD  Assistant:  none   Procedure:  Total thyroidectomy  Anesthesia:  General  Estimated Blood Loss:  minimal  Drains: none         Specimen: thyroid to pathology  Indications:  Patient returns to my practice for review of her recent clinical course and again consideration for thyroidectomy for management of Hashimoto's thyroiditis. Patient is followed by Dr. Debara PickettJeff Kerr from endocrinology. Patient continues to have inflammatory episodes. Her TSH levels have varied widely and makes it difficult to manage her medication. She continues to have intermittent episodes of hoarseness interspersed with periods of time in which her voice is largely normal. She continues to have a globus sensation. Patient desires to proceed with total thyroidectomy. This has been recommended by her endocrinologist as well.   Procedure Details: Procedure [facility] in OR #[facility].  The patient was brought to the operating room and placed in a supine position on the operating room table.  Following administration of general anesthesia, the patient was positioned and then prepped and draped in the usual aseptic fashion.  After ascertaining that an adequate level of anesthesia had been achieved, a Kocher incision was made with #15 blade.  Dissection was carried through subcutaneous tissues and platysma. Hemostasis was achieved with the electrocautery.  Skin flaps were elevated cephalad and caudad from the thyroid notch to the sternal notch.  The Mahorner self-retaining retractor was placed for exposure.  Strap muscles were incised in the midline and dissection was begun on the left side.  Strap muscles were reflected laterally.  Left thyroid lobe was small in size without significant nodules.  The left lobe was gently mobilized with blunt dissection.  Superior pole vessels were  dissected out and divided individually between small and medium Ligaclips with the Harmonic scalpel.  The thyroid lobe was rolled anteriorly.  Branches of the inferior thyroid artery were divided between small Ligaclips with the Harmonic scalpel.  Inferior venous tributaries were divided between Ligaclips.  Both the superior and inferior parathyroid glands were identified and preserved on their vascular pedicles.  The recurrent laryngeal nerve was identified and preserved along its course.  The ligament of Allyson SabalBerry was released with the electrocautery and the gland was mobilized onto the anterior trachea. Isthmus was mobilized across the midline.  There was a thin pyramidal lobe present which was dissected off of the thyroid cartilage and resected with the isthmus.  Dry pack was placed in the left neck.  Next, the right thyroid lobe was gently mobilized with blunt dissection.  Right thyroid lobe was slightly larger in size with some nodularity in the superior pole.  Superior pole vessels were dissected out and divided between small and medium Ligaclips with the Harmonic scalpel.  Superior parathyroid was identified and preserved.  Inferior venous tributaries were divided between medium Ligaclips with the Harmonic scalpel.  The right thyroid lobe was rolled anteriorly and the branches of the inferior thyroid artery divided between small Ligaclips.  The right recurrent laryngeal nerve was identified and preserved along its course.  The ligament of Allyson SabalBerry was released with the electrocautery.  The right thyroid lobe was mobilized onto the anterior trachea and the remainder of the thyroid was dissected off the anterior trachea and the thyroid was completely excised.  A suture was used to mark the right lobe. The entire thyroid gland was submitted to pathology for  review.  The neck was irrigated with warm saline.  Fibrillar was placed throughout the operative field.  Strap muscles were reapproximated in the midline with  interrupted 3-0 Vicryl sutures.  Platysma was closed with interrupted 3-0 Vicryl sutures.  Skin was closed with a running 4-0 Monocryl subcuticular suture.  Wound was washed and dried and steri-strips were applied.  Dry gauze dressing was placed.  The patient was awakened from anesthesia and brought to the recovery room.  The patient tolerated the procedure well.   Darnell Level, MD [facility] Office: [phone]

## 2018-05-06 ENCOUNTER — Encounter (HOSPITAL_COMMUNITY): Payer: Self-pay | Admitting: Surgery

## 2018-05-06 DIAGNOSIS — E063 Autoimmune thyroiditis: Secondary | ICD-10-CM | POA: Diagnosis not present

## 2018-05-06 LAB — BASIC METABOLIC PANEL
ANION GAP: 9 (ref 5–15)
BUN: 12 mg/dL (ref 6–20)
CALCIUM: 9.3 mg/dL (ref 8.9–10.3)
CO2: 26 mmol/L (ref 22–32)
CREATININE: 0.8 mg/dL (ref 0.44–1.00)
Chloride: 105 mmol/L (ref 98–111)
GFR calc non Af Amer: >60 mL/min (ref >60)
GLUCOSE: 125 mg/dL — AB (ref 70–99)
Potassium: 3.7 mmol/L (ref 3.5–5.1)
Sodium: 140 mmol/L (ref 135–145)

## 2018-05-06 MED ORDER — TRAMADOL HCL 50 MG PO TABS: 50.0000 mg | ORAL_TABLET | Freq: Four times a day (QID) | ORAL | Status: AC | PRN

## 2018-05-06 NOTE — Progress Notes (Signed)
Patient ID: Shannon HardingMichelle C Commins, female   DOB: 12/16/1975, 42 y.o.   MRN: [id number] 1 Day Post-Op   Subjective: Feels well.  No pain or other complaints.  Objective: Vital signs in last 24 hours: Temp:  [97.5 F (36.4 C)-98.7 F (37.1 C)] 98.5 F (36.9 C) (09/21 0612) Pulse Rate:  [54-84] 62 (09/21 0612) Resp:  [12-20] 15 (09/21 0612) BP: (128-158)/(77-97) 132/87 (09/21 0612) SpO2:  [96 %-100 %] 100 % (09/21 0612)    Intake/Output from previous day: 09/20 0701 - 09/21 0700 In: 2457.5 [P.O.:820; I.V.:1637.5] Out: 3710 [Urine:3700; Blood:10] Intake/Output this shift: No intake/output data recorded.  General appearance: alert, cooperative, no distress and Voice normal Incision/Wound: No swelling or drainage  Lab Results:  No results for input(s): WBC, HGB, HCT, PLT in the last 72 hours. BMET Recent Labs    05/06/18 0429  NA 140  K 3.7  CL 105  CO2 26  GLUCOSE 125*  BUN 12  CREATININE 0.80  CALCIUM 9.3     Studies/Results: No results found.  Anti-infectives: Anti-infectives (From admission, onward)   Start     Dose/Rate Route Frequency Ordered Stop   05/05/18 0600  ceFAZolin (ANCEF) IVPB 2g/100 mL premix     2 g 200 mL/hr over 30 Minutes Intravenous On call to O.R. 05/05/18 0531 05/05/18 0738      Assessment/Plan: s/p Procedure(s): TOTAL THYROIDECTOMY Doing very well postoperatively.  For discharge today.   LOS: 0 days    Mariella SaaBenjamin T Avagail Whittlesey 05/06/2018

## 2018-05-06 NOTE — Progress Notes (Signed)
Assessment unchanged. Pt verbalized understanding of dc instructions through teach back regarding when to call MD and follow up care. Discharged via foot per pt request accompanied by NT and boyfriend.

## 2018-05-08 NOTE — Progress Notes (Signed)
Please contact patient and notify of benign pathology results.  Wyett Narine M. Sherlynn Tourville, MD, FACS [facility] Office: [phone]   

## 2018-05-08 NOTE — Discharge Summary (Signed)
Physician Discharge Summary [facility]  Patient ID: Rennis HardingMichelle C Bartolome MRN: [id number] DOB/AGE: 44/10/1975 44 y.o.  Admit date: 05/05/2018 Discharge date: 05/08/2018  Admission Diagnoses:  Hashimoto's thyroiditis  Discharge Diagnoses:  Principal Problem:   Hashimoto's thyroiditis   Discharged Condition: good  Hospital Course:     Physician Discharge Summary [facility]  Patient ID: Rennis HardingMichelle C Liby MRN: [id number] DOB/AGE: 44/10/1975 44 y.o.  Admit date: 05/05/2018 Discharge date: 05/08/2018  Admission Diagnoses:  Hashimoto's thyroiditis  Discharge Diagnoses:  Principal Problem:   Hashimoto's thyroiditis   Discharged Condition: good  Hospital Course: Patient was admitted for observation following thyroid surgery.  Post op course was uncomplicated.  Pain was well controlled.  Tolerated diet.  Post op calcium level on morning following surgery was 9.3 mg/dl.  Patient was prepared for discharge home on POD#1.  Consults: None  Treatments: surgery: total thyroidectomy  Discharge Exam: Blood pressure 132/87, pulse 62, temperature 98.5 F (36.9 C), temperature source Oral, resp. rate 15, height 5\' 10"  (1.778 m), weight 78.6 kg, last menstrual period 03/23/2012, SpO2 100 %.  See progress notes.  Disposition: Home   Allergies as of 05/06/2018      Reactions   Clindamycin/lincomycin Rash      Medication List    TAKE these medications   BORIC ACID EX Place 1 suppository vaginally See admin instructions. Boric acid 600 mg vaginal suppository capsule, insert vaginally daily for 2 weeks, then 3 times per week for 2 weeks, then twice weekly   calcium carbonate 500 MG chewable tablet Commonly known as:  TUMS - dosed in mg elemental calcium Chew 2 tablets (400 mg of elemental calcium total) by mouth 3 (three) times daily.   Clobetasol Prop Emollient Base 0.05 % emollient cream Apply 1 application topically 2 (two) times  daily as needed (flare ups). For Lupus rash.   clonazePAM 0.5 MG tablet Commonly known as:  KLONOPIN Take 0.25-0.5 mg by mouth See admin instructions. Take 0.25 mg in the afternoon and 0.5 mg at night   COLACE PO Take 1 tablet by mouth every evening.   ibuprofen 200 MG tablet Commonly known as:  ADVIL,MOTRIN Take 400-600 mg by mouth every 6 (six) hours as needed for headache.   levalbuterol 45 MCG/ACT inhaler Commonly known as:  XOPENEX HFA Inhale 2 puffs into the lungs every 6 (six) hours as needed for shortness of breath. For Asthma symptoms.   levocetirizine 5 MG tablet Commonly known as:  XYZAL Take 5 mg by mouth every evening.   levothyroxine 175 MCG tablet Commonly known as:  SYNTHROID, LEVOTHROID Take 175 mcg by mouth daily before breakfast.   multivitamin with minerals Tabs tablet Take 1 tablet by mouth every evening.   oxymetazoline 0.05 % nasal spray Commonly known as:  AFRIN Place 1 spray into both nostrils at bedtime.   tinidazole 500 MG tablet Commonly known as:  TINDAMAX Take 2,000 mg by mouth daily. For 2 days   traMADol 50 MG tablet Commonly known as:  ULTRAM Take 1-2 tablets (50-100 mg total) by mouth every 6 (six) hours as needed.   traMADol 50 MG tablet Commonly known as:  ULTRAM Take 1 tablet (50 mg total) by mouth every 6 (six) hours as needed (mild pain).      Follow-up Information    Darnell LevelGerkin, Kathlean Cinco, MD. Schedule an appointment as soon as possible for a visit in 3 weeks.   Specialty:  General Surgery Why:  For  wound re-check Contact information: [address] [place] [address] [phone]           Velora Heckler, MD, [facility] Office: [phone]   Signed: Velora Heckler 05/08/2018, 8:48 AM

## 2018-10-03 ENCOUNTER — Other Ambulatory Visit: Payer: Self-pay | Admitting: Obstetrics and Gynecology

## 2018-10-03 ENCOUNTER — Other Ambulatory Visit: Payer: Self-pay | Admitting: *Deleted

## 2018-10-03 DIAGNOSIS — R102 Pelvic and perineal pain: Secondary | ICD-10-CM

## 2018-10-09 ENCOUNTER — Inpatient Hospital Stay: Admission: RE | Admit: 2018-10-09 | Payer: Managed Care, Other (non HMO) | Source: Ambulatory Visit

## 2018-10-31 ENCOUNTER — Ambulatory Visit
Admission: RE | Admit: 2018-10-31 | Discharge: 2018-10-31 | Disposition: A | Discharge status: Home / Self Care | Payer: Managed Care, Other (non HMO) | Source: Ambulatory Visit | Attending: Obstetrics and Gynecology | Admitting: Obstetrics and Gynecology

## 2018-10-31 ENCOUNTER — Other Ambulatory Visit: Payer: Self-pay

## 2018-10-31 ENCOUNTER — Other Ambulatory Visit: Payer: Managed Care, Other (non HMO)

## 2018-10-31 DIAGNOSIS — R102 Pelvic and perineal pain: Secondary | ICD-10-CM

## 2018-10-31 MED ORDER — IOPAMIDOL (ISOVUE-300) INJECTION 61%
100.0000 mL | Freq: Once | INTRAVENOUS | Status: AC | PRN
  Administered 2018-10-31: 100 mL via INTRAVENOUS
